# Patient Record
Sex: Female | Born: 1950
Health system: Southern US, Community
[De-identification: ages and names within clinical notes are randomized; demographics above are authoritative.]

## PROBLEM LIST (undated history)

## (undated) DIAGNOSIS — J189 Pneumonia, unspecified organism: Secondary | ICD-10-CM

## (undated) DIAGNOSIS — F329 Major depressive disorder, single episode, unspecified: Secondary | ICD-10-CM

## (undated) DIAGNOSIS — F32A Depression, unspecified: Secondary | ICD-10-CM

## (undated) DIAGNOSIS — M1990 Unspecified osteoarthritis, unspecified site: Secondary | ICD-10-CM

## (undated) DIAGNOSIS — K219 Gastro-esophageal reflux disease without esophagitis: Secondary | ICD-10-CM

## (undated) DIAGNOSIS — E7800 Pure hypercholesterolemia, unspecified: Secondary | ICD-10-CM

## (undated) DIAGNOSIS — H269 Unspecified cataract: Secondary | ICD-10-CM

## (undated) DIAGNOSIS — D649 Anemia, unspecified: Secondary | ICD-10-CM

## (undated) HISTORY — PX: ABDOMINAL HYSTERECTOMY: SHX81

## (undated) HISTORY — PX: TONSILLECTOMY: SUR1361

## (undated) HISTORY — DX: Unspecified cataract: H26.9

## (undated) HISTORY — PX: EYE SURGERY: SHX253

## (undated) HISTORY — PX: REPLACEMENT TOTAL KNEE: SUR1224

---

## 1998-07-30 ENCOUNTER — Other Ambulatory Visit: Admission: RE | Admit: 1998-07-30 | Discharge: 1998-07-30 | Payer: Self-pay | Admitting: Gynecology

## 1998-10-21 ENCOUNTER — Inpatient Hospital Stay (HOSPITAL_COMMUNITY): Admission: RE | Admit: 1998-10-21 | Discharge: 1998-10-23 | Payer: Self-pay | Admitting: Gynecology

## 2006-05-08 HISTORY — PX: BREAST BIOPSY: SHX20

## 2016-07-03 ENCOUNTER — Other Ambulatory Visit: Payer: Self-pay | Admitting: Endocrinology

## 2016-07-03 DIAGNOSIS — Z1231 Encounter for screening mammogram for malignant neoplasm of breast: Secondary | ICD-10-CM

## 2016-08-01 ENCOUNTER — Ambulatory Visit
Admission: RE | Admit: 2016-08-01 | Discharge: 2016-08-01 | Disposition: A | Discharge status: Home / Self Care | Payer: BLUE CROSS/BLUE SHIELD | Source: Ambulatory Visit | Attending: Endocrinology | Admitting: Endocrinology

## 2016-08-01 DIAGNOSIS — Z1231 Encounter for screening mammogram for malignant neoplasm of breast: Secondary | ICD-10-CM

## 2016-10-25 ENCOUNTER — Other Ambulatory Visit (HOSPITAL_COMMUNITY): Payer: Self-pay | Admitting: *Deleted

## 2016-10-25 NOTE — Pre-Procedure Instructions (Signed)
Paula CreeksKaren Delgado Poole  10/25/2016      [facility] [place] - [id number] [place] [address] Phone: [phone] Fax: [phone]    Your procedure is scheduled on 11/06/16.  [facility] to [facility] at 530 A.M.  Call this number if you have problems the morning of surgery:  [phone]   Remember:  Do not eat food or drink liquids after midnight.  Take these medicines the morning of surgery with A SIP OF WATER    Hydroxyzine(vistaril )if needed, sertraline(zoloft)  STOP all herbel meds, nsaids (aleve,naproxen,advil,ibuprofen) 7 days prior to surgery stating 10/30/16 including all vitamins/supplements,aspirin   Do not wear jewelry, make-up or nail polish.  Do not wear lotions, powders, or perfumes, or deoderant.  Do not shave 48 hours prior to surgery.  Men may shave face and neck.  Do not bring valuables to the hospital.  [facility] is not responsible for any belongings or valuables.  Contacts, dentures or bridgework may not be worn into surgery.  Leave your suitcase in the car.  After surgery it may be brought to your room.  For patients admitted to the hospital, discharge time will be determined by your treatment team.  Patients discharged the day of surgery will not be allowed to drive home.   Special instructions:  Special Instructions: [place] - Preparing for Surgery  Before surgery, you can play an important role.  Because skin is not sterile, your skin needs to be as free of germs as possible.  You can reduce the number of germs on you skin by washing with CHG (chlorahexidine gluconate) soap before surgery.  CHG is an antiseptic cleaner which kills germs and bonds with the skin to continue killing germs even after washing.  Please DO NOT use if you have an allergy to CHG or antibacterial soaps.  If your skin becomes reddened/irritated stop using the CHG and inform your nurse when you arrive  at Short Stay.  Do not shave (including legs and underarms) for at least 48 hours prior to the first CHG shower.  You may shave your face.  Please follow these instructions carefully:   1.  Shower with CHG Soap the night before surgery and the morning of Surgery.  2.  If you choose to wash your hair, wash your hair first as usual with your normal shampoo.  3.  After you shampoo, rinse your hair and body thoroughly to remove the Shampoo.  4.  Use CHG as you would any other liquid soap.  You can apply chg directly  to the skin and wash gently with scrungie or a clean washcloth.  5.  Apply the CHG Soap to your body ONLY FROM THE NECK DOWN.  Do not use on open wounds or open sores.  Avoid contact with your eyes ears, mouth and genitals (private parts).  Wash genitals (private parts)       with your normal soap.  6.  Wash thoroughly, paying special attention to the area where your surgery will be performed.  7.  Thoroughly rinse your body with warm water from the neck down.  8.  DO NOT shower/wash with your normal soap after using and rinsing off the CHG Soap.  9.  Pat yourself dry with a clean towel.            10.  Wear clean pajamas.  11.  Place clean sheets on your bed the night of your first shower and do not sleep with pets.  Day of Surgery  Do not apply any lotions/deodorants the morning of surgery.  Please wear clean clothes to the hospital/surgery center.  Please read over the  fact sheets that you were given.

## 2016-10-26 ENCOUNTER — Encounter (HOSPITAL_COMMUNITY)
Admission: RE | Admit: 2016-10-26 | Discharge: 2016-10-26 | Disposition: A | Discharge status: Home / Self Care | Payer: BLUE CROSS/BLUE SHIELD | Source: Ambulatory Visit | Attending: Orthopedic Surgery | Admitting: Orthopedic Surgery

## 2016-10-26 ENCOUNTER — Encounter (HOSPITAL_COMMUNITY): Payer: Self-pay

## 2016-10-26 ENCOUNTER — Other Ambulatory Visit: Payer: Self-pay

## 2016-10-26 DIAGNOSIS — R918 Other nonspecific abnormal finding of lung field: Secondary | ICD-10-CM | POA: Insufficient documentation

## 2016-10-26 DIAGNOSIS — Z01818 Encounter for other preprocedural examination: Secondary | ICD-10-CM | POA: Insufficient documentation

## 2016-10-26 DIAGNOSIS — Z01810 Encounter for preprocedural cardiovascular examination: Secondary | ICD-10-CM | POA: Diagnosis not present

## 2016-10-26 DIAGNOSIS — Z01812 Encounter for preprocedural laboratory examination: Secondary | ICD-10-CM | POA: Insufficient documentation

## 2016-10-26 HISTORY — DX: Gastro-esophageal reflux disease without esophagitis: K21.9

## 2016-10-26 HISTORY — DX: Major depressive disorder, single episode, unspecified: F32.9

## 2016-10-26 HISTORY — DX: Unspecified osteoarthritis, unspecified site: M19.90

## 2016-10-26 HISTORY — DX: Anemia, unspecified: D64.9

## 2016-10-26 HISTORY — DX: Pure hypercholesterolemia, unspecified: E78.00

## 2016-10-26 HISTORY — DX: Depression, unspecified: F32.A

## 2016-10-26 HISTORY — DX: Pneumonia, unspecified organism: J18.9

## 2016-10-26 LAB — BASIC METABOLIC PANEL
Anion gap: 9 (ref 5–15)
BUN: 20 mg/dL (ref 6–20)
CO2: 22 mmol/L (ref 22–32)
Calcium: 9.1 mg/dL (ref 8.9–10.3)
Chloride: 106 mmol/L (ref 101–111)
Creatinine, Ser: 0.75 mg/dL (ref 0.44–1.00)
GFR calc Af Amer: >60 mL/min (ref >60)
GFR calc non Af Amer: >60 mL/min (ref >60)
Glucose, Bld: 94 mg/dL (ref 65–99)
Potassium: 3.8 mmol/L (ref 3.5–5.1)
Sodium: 137 mmol/L (ref 135–145)

## 2016-10-26 LAB — CBC WITH DIFFERENTIAL/PLATELET
Basophils Absolute: 0 10*3/uL (ref 0.0–0.1)
Basophils Relative: 0 %
Eosinophils Absolute: 0.1 10*3/uL (ref 0.0–0.7)
Eosinophils Relative: 0 %
HCT: 39.4 % (ref 36.0–46.0)
Hemoglobin: 12.9 g/dL (ref 12.0–15.0)
Lymphocytes Relative: 8 %
Lymphs Abs: 1.2 10*3/uL (ref 0.7–4.0)
MCH: 28.4 pg (ref 26.0–34.0)
MCHC: 32.7 g/dL (ref 30.0–36.0)
MCV: 86.8 fL (ref 78.0–100.0)
Monocytes Absolute: 0.8 10*3/uL (ref 0.1–1.0)
Monocytes Relative: 6 %
Neutro Abs: 12.7 10*3/uL — ABNORMAL HIGH (ref 1.7–7.7)
Neutrophils Relative %: 86 %
Platelets: 107 10*3/uL — ABNORMAL LOW (ref 150–400)
RBC: 4.54 MIL/uL (ref 3.87–5.11)
RDW: 13.2 % (ref 11.5–15.5)
WBC: 14.8 10*3/uL — ABNORMAL HIGH (ref 4.0–10.5)

## 2016-10-26 LAB — URINALYSIS, ROUTINE W REFLEX MICROSCOPIC
Bilirubin Urine: NEGATIVE
Glucose, UA: NEGATIVE mg/dL
Hgb urine dipstick: NEGATIVE
Ketones, ur: NEGATIVE mg/dL
Nitrite: POSITIVE — AB
Protein, ur: NEGATIVE mg/dL
Specific Gravity, Urine: 1.019 (ref 1.005–1.030)
Squamous Epithelial / LPF: NONE SEEN
pH: 5 (ref 5.0–8.0)

## 2016-10-26 LAB — APTT: aPTT: 23 seconds — ABNORMAL LOW (ref 24–36)

## 2016-10-26 LAB — SURGICAL PCR SCREEN
MRSA, PCR: NEGATIVE
Staphylococcus aureus: NEGATIVE

## 2016-10-26 LAB — ABO/RH: ABO/RH(D): A POS

## 2016-10-26 LAB — PROTIME-INR
INR: 1.02
Prothrombin Time: 13.4 seconds (ref 11.4–15.2)

## 2016-10-26 LAB — TYPE AND SCREEN
ABO/RH(D): A POS
Antibody Screen: NEGATIVE

## 2016-10-26 NOTE — Pre-Procedure Instructions (Signed)
Paula Poole  10/26/2016     Your procedure is scheduled on Monday, November 06, 2016 at 7:30 AM.   Report to [facility] Admitting Office at 5:30 AM.   Call this number if you have problems the morning of surgery: [phone]   Questions prior to day of surgery, please call [phone] between 8 & 4 PM.   Remember:  Do not eat food or drink liquids after midnight Sunday, 11/05/16.  Take these medicines the morning of surgery with A SIP OF WATER: Sertraline (Zoloft)  Stop Multivitamins, NSAIDS (Naprosyn, Aleve, Ibuprofen, etc) and Herbal medications 7 days prior to surgery. Do not use Aspirin products 7 days prior to surgery.      Do not wear jewelry, make-up or nail polish.  Do not wear lotions, powders, perfumes or deodorant.  Do not shave 48 hours prior to surgery.  .  Do not bring valuables to the hospital.  [facility] is not responsible for any belongings or valuables.  Contacts, dentures or bridgework may not be worn into surgery.  Leave your suitcase in the car.  After surgery it may be brought to your room.  For patients admitted to the hospital, discharge time will be determined by your treatment team.    [place] - Preparing for Surgery  Before surgery, you can play an important role.  Because skin is not sterile, your skin needs to be as free of germs as possible.  You can reduce the number of germs on you skin by washing with CHG (chlorahexidine gluconate) soap before surgery.  CHG is an antiseptic cleaner which kills germs and bonds with the skin to continue killing germs even after washing.  Please DO NOT use if you have an allergy to CHG or antibacterial soaps.  If your skin becomes reddened/irritated stop using the CHG and inform your nurse when you arrive at Short Stay.  Do not shave (including legs and underarms) for at least 48 hours prior to the first CHG shower.  You may shave your face.  Please follow these instructions  carefully:   1.  Shower with CHG Soap the night before surgery and the morning of Surgery.  2.  If you choose to wash your hair, wash your hair first as usual with your normal shampoo.  3.  After you shampoo, rinse your hair and body thoroughly to remove the Shampoo.  4.  Use CHG as you would any other liquid soap.  You can apply chg directly  to the skin and wash gently with scrungie or a clean washcloth.  5.  Apply the CHG Soap to your body ONLY FROM THE NECK DOWN.  Do not use on open wounds or open sores.  Avoid contact with your eyes ears, mouth and genitals (private parts).  Wash genitals (private parts)  with your normal soap.  6.  Wash thoroughly, paying special attention to the area where your surgery will be performed.  7.  Thoroughly rinse your body with warm water from the neck down.  8.  DO NOT shower/wash with your normal soap after using and rinsing off the CHG Soap.  9.  Pat yourself dry with a clean towel.            10.  Wear clean pajamas.            11 .  Place clean sheets on your bed the night of your first shower and do not sleep with pets.  Day of Surgery  Do not apply any lotions/deodorants the morning of surgery.  Please wear clean clothes to the hospital.  Please read over the  fact sheets that you were given.

## 2016-10-26 NOTE — Progress Notes (Signed)
Pt denies cardiac history, chest pain or sob. Pt states she is not diabetic. 

## 2016-11-03 DIAGNOSIS — M1611 Unilateral primary osteoarthritis, right hip: Secondary | ICD-10-CM

## 2016-11-03 MED ORDER — DEXTROSE-NACL 5-0.45 % IV SOLN: INTRAVENOUS | Status: DC

## 2016-11-03 MED ORDER — TRANEXAMIC ACID 1000 MG/10ML IV SOLN
2000.0000 mg | INTRAVENOUS | Status: DC
  Filled 2016-11-03: qty 20

## 2016-11-03 MED ORDER — CEFAZOLIN SODIUM-DEXTROSE 2-4 GM/100ML-% IV SOLN
2.0000 g | INTRAVENOUS | Status: AC
  Administered 2016-11-06: 2 g via INTRAVENOUS
  Filled 2016-11-03: qty 100

## 2016-11-03 NOTE — H&P (Signed)
TOTAL HIP ADMISSION H&P  Patient is admitted for right total hip arthroplasty.  Subjective:  Chief Complaint: right hip pain  HPI: Paula Poole, 66 y.o. female, has a history of pain and functional disability in the right hip(s) due to arthritis and patient has failed non-surgical conservative treatments for greater than 12 weeks to include NSAID's and/or analgesics, corticosteriod injections, flexibility and strengthening excercises, use of assistive devices, weight reduction as appropriate and activity modification.  Onset of symptoms was gradual starting 1 years ago with gradually worsening course since that time.The patient noted no past surgery on the right hip(s).  Patient currently rates pain in the right hip at 10 out of 10 with activity. Patient has night pain, worsening of pain with activity and weight bearing, pain that interfers with activities of daily living and pain with passive range of motion. Patient has evidence of subchondral cysts, periarticular osteophytes, joint subluxation and joint space narrowing by imaging studies. This condition presents safety issues increasing the risk of falls.   There is no current active infection.  Patient Active Problem List   Diagnosis Date Noted  . Primary localized osteoarthritis of right hip 11/03/2016    Priority: High   Past Medical History:  Diagnosis Date  . Anemia    as a child  . Arthritis   . Depression    due to death of husband a year ago  . GERD (gastroesophageal reflux disease)    not for years  . High cholesterol   . Pneumonia     Past Surgical History:  Procedure Laterality Date  . ABDOMINAL HYSTERECTOMY    . BREAST BIOPSY  2008   (-) results per patient  . TONSILLECTOMY      No prescriptions prior to admission.   Allergies  Allergen Reactions  . Nickel Hives    Cheap jewelry makes her itchy  . Latex Itching    Social History  Substance Use Topics  . Smoking status: Former Smoker    Quit date: 09/25/2016   . Smokeless tobacco: Never Used  . Alcohol use No    Family History  Problem Relation Age of Onset  . Breast cancer Sister   . Thyroid cancer Sister   . Lymphoma Sister   . Atrial fibrillation Sister   . CVA Mother   . Colon cancer Father   . Heart attack Father   . High Cholesterol Father   . Diabetes Sister   . High Cholesterol Sister      Review of Systems  Constitutional: Negative.   HENT: Negative.   Eyes: Negative.   Respiratory: Negative.   Cardiovascular: Negative.   Gastrointestinal: Negative.   Genitourinary: Negative.   Musculoskeletal: Positive for joint pain.  Skin: Negative.   Neurological:       Poor balance  Endo/Heme/Allergies: Negative.   Psychiatric/Behavioral: Positive for depression. The patient is nervous/anxious.     Objective:  Physical Exam  Constitutional: She is oriented to person, place, and time. She appears well-developed and well-nourished.  HENT:  Head: Normocephalic and atraumatic.  Eyes: Pupils are equal, round, and reactive to light.  Neck: Normal range of motion. Neck supple.  Cardiovascular: Intact distal pulses.   Respiratory: Effort normal.  Musculoskeletal:  Patient walks with a profound right-sided limp.  Any attempts at internal rotation of the right hip causes severe pain.  The left hip has a full smooth range of motion.  Foot tap is negative bilaterally.  Skin is intact. She is neurovascularly intact distally.  Neurological: She is alert and oriented to person, place, and time.  Skin: Skin is warm and dry.  Psychiatric: She has a normal mood and affect. Her behavior is normal. Judgment and thought content normal.    Vital signs in last 24 hours:    Labs:   Estimated body mass index is 27.37 kg/m as calculated from the following:   Height as of 10/26/16: 5' 5.5" (1.664 m).   Weight as of 10/26/16: 75.8 kg (167 lb).   Imaging Review Plain radiographs demonstrate  AP pelvis and crosstable lateral the right hip  show bone-on-bone arthritis of the right hip with erosion of the superior femoral head and periacetabular cyst as well as bone spurs.  There is been lateral subluxation of the femoral head about 1 cm.  Assessment/Plan:  End stage arthritis, right hip(s)  The patient history, physical examination, clinical judgement of the provider and imaging studies are consistent with end stage degenerative joint disease of the right hip(s) and total hip arthroplasty is deemed medically necessary. The treatment options including medical management, injection therapy, arthroscopy and arthroplasty were discussed at length. The risks and benefits of total hip arthroplasty were presented and reviewed. The risks due to aseptic loosening, infection, stiffness, dislocation/subluxation,  thromboembolic complications and other imponderables were discussed.  The patient acknowledged the explanation, agreed to proceed with the plan and consent was signed. Patient is being admitted for inpatient treatment for surgery, pain control, PT, OT, prophylactic antibiotics, VTE prophylaxis, progressive ambulation and ADL's and discharge planning.The patient is planning to be discharged home with home health services.

## 2016-11-05 ENCOUNTER — Encounter (HOSPITAL_COMMUNITY): Payer: Self-pay | Admitting: Anesthesiology

## 2016-11-05 MED ORDER — BUPIVACAINE LIPOSOME 1.3 % IJ SUSP
20.0000 mL | Freq: Once | INTRAMUSCULAR | Status: DC
  Filled 2016-11-05: qty 20

## 2016-11-05 MED ORDER — TRANEXAMIC ACID 1000 MG/10ML IV SOLN
1000.0000 mg | INTRAVENOUS | Status: DC
  Filled 2016-11-05: qty 10

## 2016-11-05 MED ORDER — SODIUM CHLORIDE 0.9 % IV SOLN
2000.0000 mg | INTRAVENOUS | Status: AC
  Administered 2016-11-06: 500 mg via TOPICAL
  Filled 2016-11-05: qty 20

## 2016-11-05 MED ORDER — TRANEXAMIC ACID 1000 MG/10ML IV SOLN
1000.0000 mg | INTRAVENOUS | Status: AC
  Administered 2016-11-06: 1000 mg via INTRAVENOUS
  Filled 2016-11-05: qty 10

## 2016-11-05 NOTE — Anesthesia Preprocedure Evaluation (Addendum)
Anesthesia Evaluation  Patient identified by MRN, date of birth, ID band Patient awake    Reviewed: Allergy & Precautions, Patient's Chart, lab work & pertinent test results  Airway Mallampati: I       Dental no notable dental hx. (+) Teeth Intact   Pulmonary former smoker,    Pulmonary exam normal breath sounds clear to auscultation       Cardiovascular  Rhythm:Regular Rate:Normal     Neuro/Psych PSYCHIATRIC DISORDERS Depression    GI/Hepatic Neg liver ROS,   Endo/Other  negative endocrine ROS  Renal/GU negative Renal ROS     Musculoskeletal   Abdominal Normal abdominal exam  (+)   Peds  Hematology   Anesthesia Other Findings   Reproductive/Obstetrics                            Anesthesia Physical Anesthesia Plan  ASA: II  Anesthesia Plan: Spinal   Post-op Pain Management:    Induction:   PONV Risk Score and Plan: 2 and Ondansetron and Dexamethasone  Airway Management Planned: Natural Airway, Nasal Cannula and Simple Face Mask  Additional Equipment:   Intra-op Plan:   Post-operative Plan:   Informed Consent: I have reviewed the patients History and Physical, chart, labs and discussed the procedure including the risks, benefits and alternatives for the proposed anesthesia with the patient or authorized representative who has indicated his/her understanding and acceptance.     Plan Discussed with: CRNA and Surgeon  Anesthesia Plan Comments:        Anesthesia Quick Evaluation

## 2016-11-06 ENCOUNTER — Encounter (HOSPITAL_COMMUNITY)
Admission: RE | Disposition: A | Discharge status: Home Health | Payer: Self-pay | Source: Ambulatory Visit | Attending: Orthopedic Surgery

## 2016-11-06 ENCOUNTER — Inpatient Hospital Stay (HOSPITAL_COMMUNITY)
Admission: RE | Admit: 2016-11-06 | Discharge: 2016-11-08 | DRG: 470 | Disposition: A | Discharge status: Home Health | Payer: BLUE CROSS/BLUE SHIELD | Source: Ambulatory Visit | Attending: Orthopedic Surgery | Admitting: Orthopedic Surgery

## 2016-11-06 ENCOUNTER — Inpatient Hospital Stay (HOSPITAL_COMMUNITY): Payer: BLUE CROSS/BLUE SHIELD

## 2016-11-06 ENCOUNTER — Inpatient Hospital Stay (HOSPITAL_COMMUNITY): Payer: BLUE CROSS/BLUE SHIELD | Admitting: Anesthesiology

## 2016-11-06 DIAGNOSIS — M1611 Unilateral primary osteoarthritis, right hip: Secondary | ICD-10-CM | POA: Diagnosis present

## 2016-11-06 DIAGNOSIS — Z91040 Latex allergy status: Secondary | ICD-10-CM | POA: Diagnosis not present

## 2016-11-06 DIAGNOSIS — F329 Major depressive disorder, single episode, unspecified: Secondary | ICD-10-CM | POA: Diagnosis present

## 2016-11-06 DIAGNOSIS — Z8349 Family history of other endocrine, nutritional and metabolic diseases: Secondary | ICD-10-CM

## 2016-11-06 DIAGNOSIS — Z87891 Personal history of nicotine dependence: Secondary | ICD-10-CM | POA: Diagnosis not present

## 2016-11-06 DIAGNOSIS — Z91048 Other nonmedicinal substance allergy status: Secondary | ICD-10-CM | POA: Diagnosis not present

## 2016-11-06 DIAGNOSIS — Z419 Encounter for procedure for purposes other than remedying health state, unspecified: Secondary | ICD-10-CM

## 2016-11-06 DIAGNOSIS — K219 Gastro-esophageal reflux disease without esophagitis: Secondary | ICD-10-CM | POA: Diagnosis present

## 2016-11-06 DIAGNOSIS — D62 Acute posthemorrhagic anemia: Secondary | ICD-10-CM | POA: Diagnosis not present

## 2016-11-06 DIAGNOSIS — E7800 Pure hypercholesterolemia, unspecified: Secondary | ICD-10-CM | POA: Diagnosis present

## 2016-11-06 DIAGNOSIS — Z90710 Acquired absence of both cervix and uterus: Secondary | ICD-10-CM | POA: Diagnosis not present

## 2016-11-06 DIAGNOSIS — M25551 Pain in right hip: Secondary | ICD-10-CM | POA: Diagnosis present

## 2016-11-06 HISTORY — PX: TOTAL HIP ARTHROPLASTY: SHX124

## 2016-11-06 SURGERY — ARTHROPLASTY, HIP, TOTAL, ANTERIOR APPROACH
Anesthesia: Monitor Anesthesia Care | Site: Hip | Laterality: Right

## 2016-11-06 MED ORDER — METHOCARBAMOL 500 MG PO TABS: 500.0000 mg | ORAL_TABLET | Freq: Two times a day (BID) | ORAL | 0 refills | Status: DC

## 2016-11-06 MED ORDER — ASPIRIN EC 325 MG PO TBEC
325.0000 mg | DELAYED_RELEASE_TABLET | Freq: Every day | ORAL | Status: DC
  Administered 2016-11-07 – 2016-11-08 (×2): 325 mg via ORAL
  Filled 2016-11-06 (×2): qty 1

## 2016-11-06 MED ORDER — MIDAZOLAM HCL 2 MG/2ML IJ SOLN
INTRAMUSCULAR | Status: AC
  Filled 2016-11-06: qty 2

## 2016-11-06 MED ORDER — KCL IN DEXTROSE-NACL 20-5-0.45 MEQ/L-%-% IV SOLN
INTRAVENOUS | Status: DC
  Administered 2016-11-06: 12:00:00 via INTRAVENOUS
  Filled 2016-11-06 (×4): qty 1000

## 2016-11-06 MED ORDER — ONDANSETRON HCL 4 MG/2ML IJ SOLN
INTRAMUSCULAR | Status: DC | PRN
  Administered 2016-11-06: 4 mg via INTRAVENOUS

## 2016-11-06 MED ORDER — MENTHOL 3 MG MT LOZG: 1.0000 | LOZENGE | OROMUCOSAL | Status: DC | PRN

## 2016-11-06 MED ORDER — METOCLOPRAMIDE HCL 5 MG PO TABS: 5.0000 mg | ORAL_TABLET | Freq: Three times a day (TID) | ORAL | Status: DC | PRN

## 2016-11-06 MED ORDER — BUPIVACAINE LIPOSOME 1.3 % IJ SUSP
INTRAMUSCULAR | Status: DC | PRN
  Administered 2016-11-06: 20 mL

## 2016-11-06 MED ORDER — TRAZODONE HCL 50 MG PO TABS
50.0000 mg | ORAL_TABLET | Freq: Every day | ORAL | Status: DC
  Administered 2016-11-06 – 2016-11-07 (×2): 50 mg via ORAL
  Filled 2016-11-06 (×2): qty 1

## 2016-11-06 MED ORDER — METOCLOPRAMIDE HCL 5 MG/ML IJ SOLN: 5.0000 mg | Freq: Three times a day (TID) | INTRAMUSCULAR | Status: DC | PRN

## 2016-11-06 MED ORDER — CHLORHEXIDINE GLUCONATE 4 % EX LIQD: 60.0000 mL | Freq: Once | CUTANEOUS | Status: DC

## 2016-11-06 MED ORDER — ACETAMINOPHEN 325 MG PO TABS: 650.0000 mg | ORAL_TABLET | Freq: Four times a day (QID) | ORAL | Status: DC | PRN

## 2016-11-06 MED ORDER — METHOCARBAMOL 500 MG PO TABS
500.0000 mg | ORAL_TABLET | Freq: Four times a day (QID) | ORAL | Status: DC | PRN
  Administered 2016-11-07 – 2016-11-08 (×2): 500 mg via ORAL
  Filled 2016-11-06 (×2): qty 1

## 2016-11-06 MED ORDER — SODIUM CHLORIDE 0.9 % IV SOLN
INTRAVENOUS | Status: AC | PRN
  Administered 2016-11-06: 2000 mg via TOPICAL

## 2016-11-06 MED ORDER — LACTATED RINGERS IV SOLN
INTRAVENOUS | Status: DC | PRN
  Administered 2016-11-06: 07:00:00 via INTRAVENOUS

## 2016-11-06 MED ORDER — VITAMIN C 500 MG PO TABS
500.0000 mg | ORAL_TABLET | Freq: Every day | ORAL | Status: DC
  Administered 2016-11-06 – 2016-11-08 (×3): 500 mg via ORAL
  Filled 2016-11-06 (×3): qty 1

## 2016-11-06 MED ORDER — MIDAZOLAM HCL 5 MG/5ML IJ SOLN
INTRAMUSCULAR | Status: DC | PRN
  Administered 2016-11-06 (×2): 1 mg via INTRAVENOUS

## 2016-11-06 MED ORDER — BUPIVACAINE-EPINEPHRINE (PF) 0.5% -1:200000 IJ SOLN
INTRAMUSCULAR | Status: DC | PRN
  Administered 2016-11-06: 50 mL

## 2016-11-06 MED ORDER — BUPIVACAINE IN DEXTROSE 0.75-8.25 % IT SOLN
INTRATHECAL | Status: DC | PRN
  Administered 2016-11-06: 1.8 mL via INTRATHECAL

## 2016-11-06 MED ORDER — BUPIVACAINE-EPINEPHRINE (PF) 0.5% -1:200000 IJ SOLN
INTRAMUSCULAR | Status: AC
  Filled 2016-11-06: qty 30

## 2016-11-06 MED ORDER — PROPOFOL 500 MG/50ML IV EMUL
INTRAVENOUS | Status: DC | PRN
  Administered 2016-11-06: 50 ug via INTRAVENOUS

## 2016-11-06 MED ORDER — HYDROXYZINE HCL 25 MG PO TABS
50.0000 mg | ORAL_TABLET | Freq: Every day | ORAL | Status: DC
  Administered 2016-11-06 – 2016-11-07 (×2): 50 mg via ORAL
  Filled 2016-11-06 (×2): qty 2

## 2016-11-06 MED ORDER — PROPOFOL 10 MG/ML IV BOLUS
INTRAVENOUS | Status: AC
  Filled 2016-11-06: qty 40

## 2016-11-06 MED ORDER — CELECOXIB 200 MG PO CAPS
200.0000 mg | ORAL_CAPSULE | Freq: Two times a day (BID) | ORAL | Status: DC
  Administered 2016-11-06 – 2016-11-08 (×4): 200 mg via ORAL
  Filled 2016-11-06 (×4): qty 1

## 2016-11-06 MED ORDER — 0.9 % SODIUM CHLORIDE (POUR BTL) OPTIME
TOPICAL | Status: DC | PRN
  Administered 2016-11-06: 1000 mL

## 2016-11-06 MED ORDER — ONDANSETRON HCL 4 MG/2ML IJ SOLN
INTRAMUSCULAR | Status: AC
  Filled 2016-11-06: qty 2

## 2016-11-06 MED ORDER — OXYCODONE HCL 5 MG PO TABS
5.0000 mg | ORAL_TABLET | ORAL | Status: DC | PRN
  Administered 2016-11-06 – 2016-11-08 (×7): 10 mg via ORAL
  Filled 2016-11-06 (×7): qty 2

## 2016-11-06 MED ORDER — BISACODYL 5 MG PO TBEC: 5.0000 mg | DELAYED_RELEASE_TABLET | Freq: Every day | ORAL | Status: DC | PRN

## 2016-11-06 MED ORDER — ASPIRIN EC 325 MG PO TBEC: 325.0000 mg | DELAYED_RELEASE_TABLET | Freq: Two times a day (BID) | ORAL | 0 refills | Status: DC

## 2016-11-06 MED ORDER — DIPHENHYDRAMINE HCL 12.5 MG/5ML PO ELIX: 12.5000 mg | ORAL_SOLUTION | ORAL | Status: DC | PRN

## 2016-11-06 MED ORDER — PROPOFOL 500 MG/50ML IV EMUL
INTRAVENOUS | Status: DC | PRN
  Administered 2016-11-06: 50 ug/kg/min via INTRAVENOUS

## 2016-11-06 MED ORDER — ACETAMINOPHEN 650 MG RE SUPP: 650.0000 mg | Freq: Four times a day (QID) | RECTAL | Status: DC | PRN

## 2016-11-06 MED ORDER — DOCUSATE SODIUM 100 MG PO CAPS
100.0000 mg | ORAL_CAPSULE | Freq: Two times a day (BID) | ORAL | Status: DC
  Administered 2016-11-06 – 2016-11-08 (×4): 100 mg via ORAL
  Filled 2016-11-06 (×4): qty 1

## 2016-11-06 MED ORDER — COMPLETENATE 29-1 MG PO CHEW
1.0000 | CHEWABLE_TABLET | Freq: Every day | ORAL | Status: DC
  Filled 2016-11-06: qty 1

## 2016-11-06 MED ORDER — HYDROXYZINE PAMOATE 50 MG PO CAPS
50.0000 mg | ORAL_CAPSULE | Freq: Every day | ORAL | Status: DC
  Filled 2016-11-06: qty 1

## 2016-11-06 MED ORDER — OXYCODONE-ACETAMINOPHEN 5-325 MG PO TABS: 1.0000 | ORAL_TABLET | ORAL | 0 refills | Status: DC | PRN

## 2016-11-06 MED ORDER — FENTANYL CITRATE (PF) 250 MCG/5ML IJ SOLN
INTRAMUSCULAR | Status: AC
  Filled 2016-11-06: qty 5

## 2016-11-06 MED ORDER — PHENOL 1.4 % MT LIQD: 1.0000 | OROMUCOSAL | Status: DC | PRN

## 2016-11-06 MED ORDER — PROPOFOL 10 MG/ML IV BOLUS
INTRAVENOUS | Status: DC | PRN
  Administered 2016-11-06: 20 mg via INTRAVENOUS

## 2016-11-06 MED ORDER — ONDANSETRON HCL 4 MG/2ML IJ SOLN: 4.0000 mg | Freq: Four times a day (QID) | INTRAMUSCULAR | Status: DC | PRN

## 2016-11-06 MED ORDER — FENTANYL CITRATE (PF) 100 MCG/2ML IJ SOLN
INTRAMUSCULAR | Status: DC | PRN
  Administered 2016-11-06 (×3): 50 ug via INTRAVENOUS

## 2016-11-06 MED ORDER — ALUMINUM HYDROXIDE GEL 320 MG/5ML PO SUSP
15.0000 mL | ORAL | Status: DC | PRN
  Filled 2016-11-06: qty 30

## 2016-11-06 MED ORDER — POLYETHYLENE GLYCOL 3350 17 G PO PACK: 17.0000 g | PACK | Freq: Every day | ORAL | Status: DC | PRN

## 2016-11-06 MED ORDER — HYDROMORPHONE HCL 1 MG/ML IJ SOLN: 0.5000 mg | INTRAMUSCULAR | Status: DC | PRN

## 2016-11-06 MED ORDER — SERTRALINE HCL 100 MG PO TABS
100.0000 mg | ORAL_TABLET | Freq: Every day | ORAL | Status: DC
  Administered 2016-11-07 – 2016-11-08 (×2): 100 mg via ORAL
  Filled 2016-11-06 (×2): qty 1

## 2016-11-06 MED ORDER — METHOCARBAMOL 1000 MG/10ML IJ SOLN
500.0000 mg | Freq: Four times a day (QID) | INTRAMUSCULAR | Status: DC | PRN
  Filled 2016-11-06: qty 5

## 2016-11-06 MED ORDER — TRANEXAMIC ACID 1000 MG/10ML IV SOLN
1000.0000 mg | Freq: Once | INTRAVENOUS | Status: AC
  Administered 2016-11-06: 1000 mg via INTRAVENOUS
  Filled 2016-11-06: qty 10

## 2016-11-06 MED ORDER — DEXAMETHASONE SODIUM PHOSPHATE 10 MG/ML IJ SOLN
10.0000 mg | Freq: Once | INTRAMUSCULAR | Status: AC
  Administered 2016-11-07: 10 mg via INTRAVENOUS
  Filled 2016-11-06 (×3): qty 1

## 2016-11-06 MED ORDER — PHENYLEPHRINE HCL 10 MG/ML IJ SOLN
INTRAMUSCULAR | Status: DC | PRN
  Administered 2016-11-06 (×2): 80 ug via INTRAVENOUS

## 2016-11-06 MED ORDER — EPHEDRINE SULFATE 50 MG/ML IJ SOLN
INTRAMUSCULAR | Status: DC | PRN
  Administered 2016-11-06 (×2): 10 mg via INTRAVENOUS

## 2016-11-06 MED ORDER — CVS PRENATAL GUMMY 0.4-113.5 MG PO CHEW: CHEWABLE_TABLET | Freq: Every day | ORAL | Status: DC

## 2016-11-06 MED ORDER — ONDANSETRON HCL 4 MG PO TABS
4.0000 mg | ORAL_TABLET | Freq: Four times a day (QID) | ORAL | Status: DC | PRN
  Filled 2016-11-06: qty 1

## 2016-11-06 MED ORDER — FLEET ENEMA 7-19 GM/118ML RE ENEM: 1.0000 | ENEMA | Freq: Once | RECTAL | Status: DC | PRN

## 2016-11-06 SURGICAL SUPPLY — 48 items
BAG DECANTER FOR FLEXI CONT (MISCELLANEOUS) ×6 IMPLANT
BLADE SAW SGTL 18X1.27X75 (BLADE) ×2 IMPLANT
BLADE SURG 10 STRL SS (BLADE) ×2 IMPLANT
CAPT HIP TOTAL 2 ×2 IMPLANT
COVER PERINEAL POST (MISCELLANEOUS) ×2 IMPLANT
COVER SURGICAL LIGHT HANDLE (MISCELLANEOUS) ×2 IMPLANT
DRAPE C-ARM 42X72 X-RAY (DRAPES) ×2 IMPLANT
DRAPE STERI IOBAN 125X83 (DRAPES) ×2 IMPLANT
DRAPE U-SHAPE 47X51 STRL (DRAPES) ×4 IMPLANT
DRSG AQUACEL AG ADV 3.5X10 (GAUZE/BANDAGES/DRESSINGS) ×2 IMPLANT
DURAPREP 26ML APPLICATOR (WOUND CARE) ×2 IMPLANT
ELECT BLADE 4.0 EZ CLEAN MEGAD (MISCELLANEOUS) ×2
ELECT REM PT RETURN 9FT ADLT (ELECTROSURGICAL) ×2
ELECTRODE BLDE 4.0 EZ CLN MEGD (MISCELLANEOUS) ×1 IMPLANT
ELECTRODE REM PT RTRN 9FT ADLT (ELECTROSURGICAL) ×1 IMPLANT
FACESHIELD WRAPAROUND (MASK) ×10 IMPLANT
GLOVE BIO SURGEON STRL SZ7.5 (GLOVE) ×2 IMPLANT
GLOVE BIO SURGEON STRL SZ8.5 (GLOVE) ×2 IMPLANT
GLOVE BIOGEL PI IND STRL 8 (GLOVE) ×1 IMPLANT
GLOVE BIOGEL PI IND STRL 9 (GLOVE) ×1 IMPLANT
GLOVE BIOGEL PI INDICATOR 8 (GLOVE) ×1
GLOVE BIOGEL PI INDICATOR 9 (GLOVE) ×1
GLOVE SS N UNI LF 8.0 STRL (GLOVE) ×2 IMPLANT
GLOVE SURG SS PI 7.5 STRL IVOR (GLOVE) ×2 IMPLANT
GLOVE SURG SS PI 8.5 STRL IVOR (GLOVE) ×1
GLOVE SURG SS PI 8.5 STRL STRW (GLOVE) ×1 IMPLANT
GOWN STRL REUS W/ TWL LRG LVL3 (GOWN DISPOSABLE) ×1 IMPLANT
GOWN STRL REUS W/ TWL XL LVL3 (GOWN DISPOSABLE) ×2 IMPLANT
GOWN STRL REUS W/TWL LRG LVL3 (GOWN DISPOSABLE) ×1
GOWN STRL REUS W/TWL XL LVL3 (GOWN DISPOSABLE) ×2
KIT BASIN OR (CUSTOM PROCEDURE TRAY) ×2 IMPLANT
KIT ROOM TURNOVER OR (KITS) ×2 IMPLANT
MANIFOLD NEPTUNE II (INSTRUMENTS) ×2 IMPLANT
NEEDLE HYPO 22GX1.5 SAFETY (NEEDLE) ×4 IMPLANT
NS IRRIG 1000ML POUR BTL (IV SOLUTION) ×2 IMPLANT
PACK TOTAL JOINT (CUSTOM PROCEDURE TRAY) ×2 IMPLANT
PAD ARMBOARD 7.5X6 YLW CONV (MISCELLANEOUS) ×4 IMPLANT
SPONGE LAP 18X18 X RAY DECT (DISPOSABLE) ×2 IMPLANT
SUT VIC AB 1 CTX 36 (SUTURE) ×1
SUT VIC AB 1 CTX36XBRD ANBCTR (SUTURE) ×1 IMPLANT
SUT VIC AB 2-0 CT1 27 (SUTURE) ×2
SUT VIC AB 2-0 CT1 TAPERPNT 27 (SUTURE) ×2 IMPLANT
SUT VIC AB 3-0 PS2 18 (SUTURE) ×1
SUT VIC AB 3-0 PS2 18XBRD (SUTURE) ×1 IMPLANT
SYR CONTROL 10ML LL (SYRINGE) ×4 IMPLANT
TOWEL OR 17X24 6PK STRL BLUE (TOWEL DISPOSABLE) ×2 IMPLANT
TOWEL OR 17X26 10 PK STRL BLUE (TOWEL DISPOSABLE) ×2 IMPLANT
TRAY CATH 16FR W/PLASTIC CATH (SET/KITS/TRAYS/PACK) IMPLANT

## 2016-11-06 NOTE — Anesthesia Procedure Notes (Signed)
Spinal  Start time: 11/06/2016 7:46 AM End time: 11/06/2016 7:49 AM Staffing Anesthesiologist: Leilani AbleHATCHETT, Abbigayle Toole Performed: anesthesiologist  Preanesthetic Checklist Completed: patient identified, surgical consent, pre-op evaluation, timeout performed, IV checked, risks and benefits discussed and monitors and equipment checked Spinal Block Patient position: sitting Prep: ChloraPrep and site prepped and draped Patient monitoring: heart rate, cardiac monitor, continuous pulse ox and blood pressure Approach: midline Location: L3-4 Injection technique: single-shot Needle Needle type: Pencan  Needle gauge: 24 G Needle length: 10 cm Assessment Sensory level: T8

## 2016-11-06 NOTE — Op Note (Signed)
OPERATIVE REPORT    DATE OF PROCEDURE:  11/06/2016       PREOPERATIVE DIAGNOSIS:  RIGHT HIP OSTEOARTHRITIS                                                          POSTOPERATIVE DIAGNOSIS:  RIGHT HIP OSTEOARTHRITIS                                                           PROCEDURE: Anterior R total hip arthroplasty using a 48 mm DePuy Pinnacle  Cup, [employer], 0-degree polyethylene liner, a +1 32 mm ceramic head, a 2 hi offset Depuy Triloc stem   SURGEON: ZOXWR,UEAVW J    ASSISTANT:   Eric K. [employer]  (present throughout entire procedure and necessary for timely completion of the procedure)   ANESTHESIA: Spinal BLOOD LOSS: 400 FLUID REPLACEMENT: 1500 crystalloid Antibiotic: 2gm ancef Tranexamic Acid: 1gm iv 2 gm topical COMPLICATIONS: none    INDICATIONS FOR PROCEDURE: A 66 y.o. year-old With  RIGHT HIP OSTEOARTHRITIS   for 2 years, x-rays show bone-on-bone arthritic changes, and osteophytes. Despite conservative measures with observation, anti-inflammatory medicine, narcotics, use of a cane, has severe unremitting pain and can ambulate only a few blocks before resting. Patient desires elective R total hip arthroplasty to decrease pain and increase function. The risks, benefits, and alternatives were discussed at length including but not limited to the risks of infection, bleeding, nerve injury, stiffness, blood clots, the need for revision surgery, cardiopulmonary complications, among others, and they were willing to proceed. Questions answered     PROCEDURE IN DETAIL: The patient was identified by armband,  received preoperative IV antibiotics in the holding area at [facility], taken to the operating room , appropriate anesthetic monitors  were attached and  anesthesia was induced with the patienton the gurney. The HANA boots were applied to the feet and he was then transferred to the HANA table with a peroneal post and support underneath the  non-operative le, which was locked in 5 lb traction. Theoperative lower extremity was then prepped and draped in the usual sterile fashion from just above the iliac crest to the knee. And a timeout procedure was performed. We then made a 12 cm incision along the interval at the leading edge of the tensor fascia lata of starting at 2 cm lateral to and 2 cm distal to the ASIS. Small bleeders in the skin and subcutaneous tissue identified and cauterized we dissected down to the fascia and made an incision in the fascia allowing [place] to elevate the fascia of the tensor muscle and exploited the interval between the rectus and the tensor fascia lata. A Hohmann retractor was then placed along the superior neck of the femur and a Cobra retractor along the inferior neck of the femur we teed the capsule starting out at the superior anterior aspect of the acetabulum going distally and made the T along the neck both leaflets of the T were tagged with #2 Ethibond suture. Cobra retractors were then placed along the inferior and superior neck allowing [place] to perform a standard neck  cut and removed the femoral head with a power corkscrew. We then placed a right angle Hohmann retractor along the anterior aspect of the acetabulum a spiked Cobra in the cotyloid notch and posteriorly a Muelller retractor. We then sequentially reamed up to a 48 mm basket reamer obtaining good coverage in all quadrants, verified by C-arm imaging. Under C-arm control with and hammered into place a 48 mm Pinnacle cup in 45 of abduction and 15 of anteversion. The cup seated nicely and required no supplemental screws. We then placed a central hole Eliminator and a 0 polyethylene liner. The foot was then externally rotated to 110, the HANA elevator was placed around the flare of the greater trochanter and the limb was extended and abducted delivering the proximal femur up into the wound. A medium Hohmann retractor was placed over the greater trochanter and a  Mueller retractor along the posterior femoral neck completing the exposure. We then performed releases superiorly and and inferiorly of the capsule going back to the pirformis fossa superiorly and to the lesser trochanter inferiorly. We then entered the proximal femur with the box cutting offset chisel followed by, a canal sounder, the chili pepper and broaching up to a 2 broach. This seated nicely and we reamed the calcar. A trial reduction was performed with a 1 mm 32 mm head.The limb lengths were excellent the hip was stable in 90 of external rotation. At this point the trial components removed and we hammered into place a # 2 Tri-Lock stem with [employer] coating. This was a hi offset stem and a + 1 32 mm ceramic ball was then hammered into place the hip was reduced and final C-arm images obtained. The wound was thoroughly irrigated with normal saline solution. We repaired the ant capsule and the tensor fascia lot a with running 0 vicryl suture. the subcutaneous tissue was closed with 2-0 and 3-0 Vicryl suture followed by an Aquacil dressing. At this point the patient was awaken and transferred to hospital gurney without difficulty. The subcutaneous tissue with 0 and 2-0 undyed Vicryl suture and the skin with running  3-0 vicryl subcuticular suture. Aquacil dressing was applied. The patient was then unclamped, rolled supine, awaken extubated and taken to recovery room without difficulty in stable condition.   Nasia Cannan J 11/06/2016, 9:03 AM

## 2016-11-06 NOTE — Discharge Instructions (Signed)

## 2016-11-06 NOTE — Interval H&P Note (Signed)
History and Physical Interval Note:  11/06/2016 7:01 AM  Paula Poole  has presented today for surgery, with the diagnosis of RIGHT HIP OSTEOARTHRITIS  The various methods of treatment have been discussed with the patient and family. After consideration of risks, benefits and other options for treatment, the patient has consented to  Procedure(s): TOTAL HIP ARTHROPLASTY ANTERIOR APPROACH (Right) as a surgical intervention .  The patient's history has been reviewed, patient examined, no change in status, stable for surgery.  I have reviewed the patient's chart and labs.  Questions were answered to the patient's satisfaction.     Nestor LewandowskyOWAN,Libia Fazzini J

## 2016-11-06 NOTE — Evaluation (Signed)
Physical Therapy Evaluation Patient Details Name: Paula Poole MRN: [id number] DOB: 1950-10-02 Today's Date: 11/06/2016   History of Present Illness  Pt is a 66 y/o female s/p R THA, direct anterior approach, secondary to R hip OA. PMH includes arthritis and depression.   Clinical Impression  Pt s/p surgery above with deficits below. PTA, pt was using a cane for ambulation. Upon evaluation, pt anxious about pain and movement and asked multiple times if the pain she was having was normal. Limited ambulation distance this session secondary to reports of dizziness. Pt requiring multiple safety cues throughout session and min guard assist to ensure safety with mobility. Pt reports her sister will be available to help upon return home. Follow up recommendations per MD arrangements. Will continue to follow acutely to maximize functional mobility independence.     Follow Up Recommendations DC plan and follow up therapy as arranged by surgeon;Supervision/Assistance - 24 hour    Equipment Recommendations  None recommended by PT    Recommendations for Other Services       Precautions / Restrictions Precautions Precautions: None Precaution Comments: Reviewed supine ther ex with pt from THA packet Restrictions Weight Bearing Restrictions: Yes RLE Weight Bearing: Weight bearing as tolerated      Mobility  Bed Mobility Overal bed mobility: Needs Assistance Bed Mobility: Supine to Sit     Supine to sit: Min guard     General bed mobility comments: Min guard for safety. Verbal cues for sequencing.   Transfers Overall transfer level: Needs assistance Equipment used: Rolling walker (2 wheeled) Transfers: Sit to/from Stand Sit to Stand: Min assist         General transfer comment: Min A for steadying once standing. Verbal cues for safe hand placement, although pt continued to pull up from RW. States "I just have all this stuff hooked up to me, but I know how to do it correctly."    Ambulation/Gait Ambulation/Gait assistance: Min guard Ambulation Distance (Feet): 20 Feet Assistive device: Rolling walker (2 wheeled) Gait Pattern/deviations: Step-to pattern;Step-through pattern;Decreased step length - right;Decreased weight shift to right;Decreased step length - left;Antalgic Gait velocity: Decreased Gait velocity interpretation: Below normal speed for age/gender General Gait Details: Slow, antalgic gait secondary to post op pain and weakness. Pt required verbal safety cues for sequencing with RW. Pt initially reporting "out of body experience" and asked about dizziness, however, pt declined symptoms. Pt reporting dizziness as she walked further, therefore distance limited.   Stairs            Wheelchair Mobility    Modified Rankin (Stroke Patients Only)       Balance Overall balance assessment: Needs assistance Sitting-balance support: No upper extremity supported;Feet supported Sitting balance-Leahy Scale: Good     Standing balance support: Bilateral upper extremity supported;During functional activity Standing balance-Leahy Scale: Poor Standing balance comment: Reliant on BUE support for balance.                              Pertinent Vitals/Pain Pain Assessment: 0-10 Pain Score: 7  Pain Location: R Hip  Pain Descriptors / Indicators: Aching;Sore;Operative site guarding;Constant Pain Intervention(s): Limited activity within patient's tolerance;Monitored during session;Repositioned    Home Living Family/patient expects to be discharged to:: Private residence Living Arrangements: Other relatives (Sister ) Available Help at Discharge: Family;Available 24 hours/day Type of Home: Other(Comment) (condo ) Home Access: Level entry     Home Layout: One level Home Equipment: Shower  seat - built in;Grab bars - tub/shower;Grab bars - toilet;Walker - 2 wheels;Cane - single point      Prior Function Level of Independence: Independent with  assistive device(s)         Comments: Used cane at baseline      Hand Dominance   Dominant Hand: Right    Extremity/Trunk Assessment   Upper Extremity Assessment Upper Extremity Assessment: Overall WFL for tasks assessed    Lower Extremity Assessment Lower Extremity Assessment: RLE deficits/detail RLE Deficits / Details: Sensory in tact. Able to perform exercises below. Pt able to perform exercises below.     Cervical / Trunk Assessment Cervical / Trunk Assessment: Normal  Communication   Communication: No difficulties  Cognition Arousal/Alertness: Awake/alert Behavior During Therapy: Anxious Overall Cognitive Status: Within Functional Limits for tasks assessed                                 General Comments: Pt asking multiple times if the pain and symptoms are normal.       General Comments General comments (skin integrity, edema, etc.): Pt ensured throughout session, that her symptoms were not abnormal for day of surgery. Required verbal cues for sequencing during supine ther ex as pt was not controlling RLE with exercises.     Exercises Total Joint Exercises Ankle Circles/Pumps: AROM;Both;10 reps;Supine Quad Sets: AROM;Right;10 reps;Supine Short Arc Quad: AROM;Right;10 reps;Supine Heel Slides: AROM;Right;10 reps;Supine Hip ABduction/ADduction: AROM;Right;10 reps;Supine   Assessment/Plan    PT Assessment Patient needs continued PT services  PT Problem List Decreased strength;Decreased activity tolerance;Decreased balance;Decreased mobility;Decreased knowledge of use of DME;Decreased knowledge of precautions;Pain       PT Treatment Interventions DME instruction;Gait training;Functional mobility training;Therapeutic exercise;Therapeutic activities;Balance training;Neuromuscular re-education;Patient/family education    PT Goals (Current goals can be found in the Care Plan section)  Acute Rehab PT Goals Patient Stated Goal: to go home  PT Goal  Formulation: With patient Time For Goal Achievement: 11/13/16 Potential to Achieve Goals: Good    Frequency 7X/week   Barriers to discharge        Co-evaluation               AM-PAC PT "6 Clicks" Daily Activity  Outcome Measure Difficulty turning over in bed (including adjusting bedclothes, sheets and blankets)?: A Little Difficulty moving from lying on back to sitting on the side of the bed? : A Little Difficulty sitting down on and standing up from a chair with arms (e.g., wheelchair, bedside commode, etc,.)?: Total Help needed moving to and from a bed to chair (including a wheelchair)?: A Little Help needed walking in hospital room?: A Little Help needed climbing 3-5 steps with a railing? : A Lot 6 Click Score: 15    End of Session Equipment Utilized During Treatment: Gait belt Activity Tolerance: Treatment limited secondary to medical complications (Comment) (dizziness) Patient left: in chair;with call bell/phone within reach Nurse Communication: Mobility status PT Visit Diagnosis: Other abnormalities of gait and mobility (R26.89);Pain Pain - Right/Left: Right Pain - part of body: Hip    Time: [id number] PT Time Calculation (min) (ACUTE ONLY): 20 min   Charges:   PT Evaluation $PT Eval Low Complexity: 1 Procedure PT Treatments $Gait Training: 8-22 mins   PT G Codes:        Gladys DammeBrittany Aarion Kittrell, PT, DPT  Acute Rehabilitation Services  Pager: [phone]   Lehman PromBrittany S [place]ree 11/06/2016, 3:34 PM

## 2016-11-06 NOTE — Transfer of Care (Signed)
Immediate Anesthesia Transfer of Care Note  Patient: Paula Poole  Procedure(s) Performed: Procedure(s): TOTAL HIP ARTHROPLASTY ANTERIOR APPROACH (Right)  Patient Location: PACU  Anesthesia Type:MAC and Spinal  Level of Consciousness: awake, alert , oriented and sedated  Airway & Oxygen Therapy: Patient Spontanous Breathing and Patient connected to nasal cannula oxygen  Post-op Assessment: Report given to RN, Post -op Vital signs reviewed and stable and Patient moving all extremities  Post vital signs: Reviewed and stable  Last Vitals:  Vitals:   11/06/16 0635  BP: (!) 124/47  Pulse: 71  Resp: 20  Temp: 36.7 C    Last Pain:  Vitals:   11/06/16 0635  TempSrc: Oral         Complications: No apparent anesthesia complications

## 2016-11-06 NOTE — Anesthesia Postprocedure Evaluation (Signed)
Anesthesia Post Note  Patient: Paula Poole  Procedure(s) Performed: Procedure(s) (LRB): TOTAL HIP ARTHROPLASTY ANTERIOR APPROACH (Right)     Patient location during evaluation: PACU Anesthesia Type: Spinal Level of consciousness: awake Pain management: pain level controlled Vital Signs Assessment: post-procedure vital signs reviewed and stable Respiratory status: spontaneous breathing Cardiovascular status: stable Postop Assessment: no headache, no backache, spinal receding, patient able to bend at knees and no signs of nausea or vomiting Anesthetic complications: no    Last Vitals:  Vitals:   11/06/16 1025 11/06/16 1030  BP: (!) 96/56   Pulse: 81 80  Resp: 13 14  Temp:      Last Pain:  Vitals:   11/06/16 0940  TempSrc:   PainSc: 0-No pain   Pain Goal:                 Tangee Marszalek JR,JOHN Traci Plemons

## 2016-11-06 NOTE — Addendum Note (Signed)
Addendum  created 11/06/16 1134 by Leilani AbleHatchett, Shadoe Bethel, MD   Anesthesia Intra Meds edited

## 2016-11-07 ENCOUNTER — Encounter (HOSPITAL_COMMUNITY): Payer: Self-pay | Admitting: General Practice

## 2016-11-07 LAB — BASIC METABOLIC PANEL
Anion gap: 7 (ref 5–15)
BUN: 11 mg/dL (ref 6–20)
CO2: 22 mmol/L (ref 22–32)
Calcium: 8.1 mg/dL — ABNORMAL LOW (ref 8.9–10.3)
Chloride: 108 mmol/L (ref 101–111)
Creatinine, Ser: 0.68 mg/dL (ref 0.44–1.00)
GFR calc Af Amer: >60 mL/min (ref >60)
GFR calc non Af Amer: >60 mL/min (ref >60)
Glucose, Bld: 145 mg/dL — ABNORMAL HIGH (ref 65–99)
Potassium: 4 mmol/L (ref 3.5–5.1)
Sodium: 137 mmol/L (ref 135–145)

## 2016-11-07 LAB — CBC
HCT: 30.4 % — ABNORMAL LOW (ref 36.0–46.0)
Hemoglobin: 9.8 g/dL — ABNORMAL LOW (ref 12.0–15.0)
MCH: 29 pg (ref 26.0–34.0)
MCHC: 32.2 g/dL (ref 30.0–36.0)
MCV: 89.9 fL (ref 78.0–100.0)
Platelets: 60 10*3/uL — ABNORMAL LOW (ref 150–400)
RBC: 3.38 MIL/uL — ABNORMAL LOW (ref 3.87–5.11)
RDW: 13.8 % (ref 11.5–15.5)
WBC: 7.5 10*3/uL (ref 4.0–10.5)

## 2016-11-07 MED ORDER — PRENATAL PLUS 27-1 MG PO TABS
1.0000 | ORAL_TABLET | Freq: Every day | ORAL | Status: DC
  Administered 2016-11-07: 1 via ORAL
  Filled 2016-11-07 (×2): qty 1

## 2016-11-07 NOTE — Progress Notes (Signed)
Physical Therapy Treatment Patient Details Name: Paula Poole MRN: [id number] DOB: [birth year] Today's Date: 11/07/2016    History of Present Illness Pt is a 66 y/o female s/p R THA, direct anterior approach, secondary to R hip OA. PMH includes arthritis and depression.     PT Comments    On arrival patient drenched in sweat.  Assisted patient to rest room and helped her changed her gowns.  Pt reports feeling better.  Vital obtained and no fever noted.  Pt able to advance gait distance this pm and reviewed HEP in it's entirety.  Pt to d/c home tomorrow and will plan to f/u in am.     Follow Up Recommendations  DC plan and follow up therapy as arranged by surgeon;Supervision/Assistance - 24 hour     Equipment Recommendations  None recommended by PT    Recommendations for Other Services       Precautions / Restrictions Precautions Precautions: None Precaution Comments: Reviewed supine, seated and standing ther ex with pt from THA packet Restrictions Weight Bearing Restrictions: Yes RLE Weight Bearing: Weight bearing as tolerated    Mobility  Bed Mobility Overal bed mobility: Needs Assistance Bed Mobility: Supine to Sit     Supine to sit: Supervision;HOB elevated     General bed mobility comments: Increased time with use of rail, guarded due to pain.    Transfers Overall transfer level: Needs assistance Equipment used: Rolling walker (2 wheeled) Transfers: Sit to/from Stand Sit to Stand: Supervision         General transfer comment: Cues for hand placement and keeping RW close during transitions from sit to stand and stand to sit.  Pt required review of technique for safety.    Ambulation/Gait Ambulation/Gait assistance: Min guard Ambulation Distance (Feet): 300 Feet Assistive device: Rolling walker (2 wheeled) Gait Pattern/deviations: Step-through pattern;Decreased stride length;Trunk flexed;Antalgic Gait velocity: Decreased Gait velocity interpretation: Below  normal speed for age/gender General Gait Details: Less antalgic this afternoon during gait training.  Pt required cues for upper trunk control and pacing.     Stairs            Wheelchair Mobility    Modified Rankin (Stroke Patients Only)       Balance Overall balance assessment: Needs assistance Sitting-balance support: No upper extremity supported;Feet supported Sitting balance-Leahy Scale: Good     Standing balance support: Bilateral upper extremity supported;During functional activity Standing balance-Leahy Scale: Fair Standing balance comment: Reliant on BUE support for balance.                             Cognition Arousal/Alertness: Awake/alert Behavior During Therapy: Anxious;WFL for tasks assessed/performed Overall Cognitive Status: Within Functional Limits for tasks assessed                                 General Comments: Pt remains anxious during session.        Exercises Total Joint Exercises Ankle Circles/Pumps: AROM;Both;10 reps;Supine Quad Sets: AROM;Right;10 reps;Supine Short Arc Quad: AROM;Right;10 reps;Supine Heel Slides: AROM;Right;10 reps;Supine Hip ABduction/ADduction: AROM;Right;10 reps;Supine;20 reps;Standing (1x10 in supine, 1x10 in standing.  ) Long Arc Quad: AROM;Right;10 reps;Supine Knee Flexion: AROM;Right;10 reps;Standing Marching in Standing: AROM;Right;10 reps;Standing Standing Hip Extension: AROM;Right;10 reps;Standing    General Comments        Pertinent Vitals/Pain Pain Assessment: 0-10 Pain Score: 8  Pain Location: R Hip  Pain Descriptors /  Indicators: Aching;Sore;Operative site guarding;Constant Pain Intervention(s): Monitored during session;Repositioned    Home Living                      Prior Function            PT Goals (current goals can now be found in the care plan section) Acute Rehab PT Goals Patient Stated Goal: to go home  Potential to Achieve Goals: Good Progress  towards PT goals: Progressing toward goals    Frequency    7X/week      PT Plan Current plan remains appropriate    Co-evaluation              AM-PAC PT "6 Clicks" Daily Activity  Outcome Measure  Difficulty turning over in bed (including adjusting bedclothes, sheets and blankets)?: A Little Difficulty moving from lying on back to sitting on the side of the bed? : A Little Difficulty sitting down on and standing up from a chair with arms (e.g., wheelchair, bedside commode, etc,.)?: A Little Help needed moving to and from a bed to chair (including a wheelchair)?: A Little Help needed walking in hospital room?: A Little Help needed climbing 3-5 steps with a railing? : A Little 6 Click Score: 18    End of Session Equipment Utilized During Treatment: Gait belt Activity Tolerance: Patient tolerated treatment well Patient left: in chair;with call bell/phone within reach Nurse Communication: Mobility status PT Visit Diagnosis: Other abnormalities of gait and mobility (R26.89);Pain Pain - Right/Left: Right Pain - part of body: Hip     Time: 1520-1605 PT Time Calculation (min) (ACUTE ONLY): 45 min  Charges:  $Gait Training: 8-22 mins $Therapeutic Exercise: 8-22 mins $Therapeutic Activity: 8-22 mins                    G Codes:       Paula Poole, PTA pager [phone]    Paula Poole 11/07/2016, 4:12 PM

## 2016-11-07 NOTE — Progress Notes (Signed)
PT Cancellation Note  Patient Details Name: Rose FillersKaren Sporrer MRN: [id number] DOB: Nov 27, 1950   Cancelled Treatment:    Reason Eval/Treat Not Completed: (P)  (RN performing history assessment, will return to perform PT intervention.  )   Florestine Aversimee J Lamarr Feenstra 11/07/2016, 9:42 AM  Joycelyn RuaAimee Abbigaile Rockman, PTA pager [phone]

## 2016-11-07 NOTE — Progress Notes (Signed)
Physical Therapy Treatment Patient Details Name: Paula Poole MRN: [id number] DOB: 03-28-1951 Today's Date: 11/07/2016    History of Present Illness Pt is a 66 y/o female s/p R THA, direct anterior approach, secondary to R hip OA. PMH includes arthritis and depression.     PT Comments    Pt performed increased gait during session this am.  Pt progressed to stair training and standing exercises.  Moaning and grimacing throughout tx but tolerated activity well.   Will f/u in pm for PT session.  Plan to return home remains appropriate.     Follow Up Recommendations  DC plan and follow up therapy as arranged by surgeon;Supervision/Assistance - 24 hour     Equipment Recommendations  None recommended by PT    Recommendations for Other Services       Precautions / Restrictions Precautions Precautions: None Precaution Comments: Reviewed supine, seated and standing ther ex with pt from THA packet Restrictions Weight Bearing Restrictions: Yes RLE Weight Bearing: Weight bearing as tolerated    Mobility  Bed Mobility Overal bed mobility: Needs Assistance Bed Mobility: Supine to Sit     Supine to sit: Supervision;HOB elevated     General bed mobility comments: Increased time with use of rail, guarded due to pain.    Transfers Overall transfer level: Needs assistance Equipment used: Rolling walker (2 wheeled) Transfers: Sit to/from Stand Sit to Stand: Supervision         General transfer comment: Cues for hand placement and keeping RW close during transitions from sit to stand and stand to sit.  Pt required review of technique for safety.    Ambulation/Gait Ambulation/Gait assistance: Min guard Ambulation Distance (Feet): 80 Feet Assistive device: Rolling walker (2 wheeled) Gait Pattern/deviations: Step-through pattern;Decreased stride length;Trunk flexed;Antalgic Gait velocity: Decreased Gait velocity interpretation: Below normal speed for age/gender General Gait  Details: Cues for gait symmetry and upper trunk control.  Pt remains slow and guarded due to pain but no LOB noted.  Pt required cues for turning and backing.     Stairs Stairs: Yes   Stair Management: Two rails;No rails;Forwards;With walker Number of Stairs: 4 (x2 trials of curb training and x 2 stairs with B rails.  ) General stair comments: Curb training:  cues for sequencing and RW placement.  Stair training: Cues for hand placement on railing and sequencing.    Wheelchair Mobility    Modified Rankin (Stroke Patients Only)       Balance     Sitting balance-Leahy Scale: Good       Standing balance-Leahy Scale: Fair                              Cognition Arousal/Alertness: Awake/alert Behavior During Therapy: Anxious;WFL for tasks assessed/performed Overall Cognitive Status: Within Functional Limits for tasks assessed                                        Exercises Total Joint Exercises Ankle Circles/Pumps: AROM;Both;10 reps;Supine Quad Sets: AROM;Right;10 reps;Supine Short Arc Quad: AROM;Right;10 reps;Supine Heel Slides: AROM;Right;10 reps;Supine Hip ABduction/ADduction: AROM;Right;10 reps;Supine;20 reps;Standing (1x10 supine and 1x10 standing) Long Arc Quad: AROM;Right;10 reps;Supine Knee Flexion: AROM;Right;10 reps;Standing Marching in Standing: AROM;Right;10 reps;Standing Standing Hip Extension: AROM;Right;10 reps;Standing    General Comments        Pertinent Vitals/Pain Pain Assessment: 0-10 Pain Score: 8  Pain  Location: R Hip  Pain Descriptors / Indicators: Aching;Sore;Operative site guarding;Constant Pain Intervention(s): Monitored during session;Repositioned    Home Living Family/patient expects to be discharged to:: Private residence Living Arrangements: Other relatives                  Prior Function            PT Goals (current goals can now be found in the care plan section) Acute Rehab PT  Goals Patient Stated Goal: to go home  Potential to Achieve Goals: Good Progress towards PT goals: Progressing toward goals    Frequency    7X/week      PT Plan Current plan remains appropriate    Co-evaluation              AM-PAC PT "6 Clicks" Daily Activity  Outcome Measure  Difficulty turning over in bed (including adjusting bedclothes, sheets and blankets)?: A Little Difficulty moving from lying on back to sitting on the side of the bed? : A Little Difficulty sitting down on and standing up from a chair with arms (e.g., wheelchair, bedside commode, etc,.)?: A Little Help needed moving to and from a bed to chair (including a wheelchair)?: A Little Help needed walking in hospital room?: A Little Help needed climbing 3-5 steps with a railing? : A Little 6 Click Score: 18    End of Session Equipment Utilized During Treatment: Gait belt Activity Tolerance: Patient tolerated treatment well Patient left: in chair;with call bell/phone within reach Nurse Communication: Mobility status PT Visit Diagnosis: Other abnormalities of gait and mobility (R26.89);Pain Pain - Right/Left: Right     Time: 4132-44011023-1059 PT Time Calculation (min) (ACUTE ONLY): 36 min  Charges:  $Gait Training: 8-22 mins $Therapeutic Exercise: 8-22 mins                    G Codes:       Joycelyn Ruaimee Arvie Bartholomew, PTA pager [phone]    Florestine Aversimee J Emet Rafanan 11/07/2016, 10:59 AM

## 2016-11-07 NOTE — Progress Notes (Signed)
PATIENT ID: Paula Poole  MRN: [id number]  DOB/AGE:  Sep 25, 1950 / 66 y.o.  1 Day Post-Op Procedure(s) (LRB): TOTAL HIP ARTHROPLASTY ANTERIOR APPROACH (Right)    PROGRESS NOTE Subjective: Patient is alert, oriented, no Nausea, no Vomiting, yes passing gas, . Taking PO well. Denies SOB, Chest or Calf Pain. Using Incentive Spirometer, PAS in place. Ambulate 20' Patient reports pain as  4/10  .    Objective: Vital signs in last 24 hours: Vitals:   11/06/16 1035 11/06/16 1116 11/06/16 2109 11/07/16 0522  BP:  94/66 (!) 109/55 (!) 102/51  Pulse:  84 94 83  Resp:  18 18 17   Temp: 97.8 F (36.6 C) 98 F (36.7 C) 100 F (37.8 C) 98.6 F (37 C)  TempSrc:  Oral Oral Oral  SpO2:  99% 94% 97%      Intake/Output from previous day: I/O last 3 completed shifts: In: 1611.3 [P.O.:240; I.V.:1371.3] Out: 1000 [Urine:800; Blood:200]   Intake/Output this shift: Total I/O In: 1370 [P.O.:120; I.V.:1250] Out: -    LABORATORY DATA: No results for input(s): WBC, HGB, HCT, PLT, NA, K, CL, CO2, BUN, CREATININE, GLUCOSE, GLUCAP, INR, CALCIUM in the last 72 hours.  Invalid input(s): PT, 2  Examination: Neurologically intact ABD soft Neurovascular intact Sensation intact distally Intact pulses distally Dorsiflexion/Plantar flexion intact Incision: dressing C/D/I No cellulitis present Compartment soft} XR AP&Lat of hip shows well placed\fixed THA  Assessment:   1 Day Post-Op Procedure(s) (LRB): TOTAL HIP ARTHROPLASTY ANTERIOR APPROACH (Right) ADDITIONAL DIAGNOSIS:  Expected Acute Blood Loss Anemia,   Plan: PT/OT WBAT, THA  DVT Prophylaxis: SCDx72 hrs, ASA 325 mg BID x 2 weeks  DISCHARGE PLAN: Home, tomorrow  DISCHARGE NEEDS: HHPT, Walker and 3-in-1 comode seatPatient ID: Paula Poole, female   DOB: Sep 25, 1950, 66 y.o.   MRN: [id number]

## 2016-11-08 LAB — CBC
HCT: 30.6 % — ABNORMAL LOW (ref 36.0–46.0)
Hemoglobin: 9.8 g/dL — ABNORMAL LOW (ref 12.0–15.0)
MCH: 28.5 pg (ref 26.0–34.0)
MCHC: 32 g/dL (ref 30.0–36.0)
MCV: 89 fL (ref 78.0–100.0)
Platelets: 68 10*3/uL — ABNORMAL LOW (ref 150–400)
RBC: 3.44 MIL/uL — ABNORMAL LOW (ref 3.87–5.11)
RDW: 13.5 % (ref 11.5–15.5)
WBC: 13.7 10*3/uL — ABNORMAL HIGH (ref 4.0–10.5)

## 2016-11-08 NOTE — Progress Notes (Signed)
Physical Therapy Treatment Patient Details Name: Paula Poole MRN: [id number] DOB: [birth year] Today's Date: 11/08/2016    History of Present Illness Pt is a 66 y/o female s/p R THA, direct anterior approach, secondary to R hip OA. PMH includes arthritis and depression.     PT Comments    Pt performed increased gait and appears more painful this afternoon.  Pt remains motivated and ambulated with shoes on and reports improved support.  Pt to d/c home after d/c instructions from RN with HHPT to follow up.   Follow Up Recommendations  DC plan and follow up therapy as arranged by surgeon;Supervision/Assistance - 24 hour     Equipment Recommendations  None recommended by PT    Recommendations for Other Services       Precautions / Restrictions Precautions Precautions: None Precaution Comments: Pt educated on continuation of walking program to improve strength and endurance.   Restrictions Weight Bearing Restrictions: Yes RLE Weight Bearing: Weight bearing as tolerated    Mobility  Bed Mobility Overal bed mobility: Modified Independent Bed Mobility: Supine to Sit     Supine to sit: Modified independent (Device/Increase time)     General bed mobility comments: Increased time, no assistance needed.    Transfers Overall transfer level: Needs assistance Equipment used: Rolling walker (2 wheeled) Transfers: Sit to/from Stand Sit to Stand: Supervision         General transfer comment: Cues for hand placement and keeping RW close during transitions from sit to stand and stand to sit.  Pt required review of technique for safety.    Ambulation/Gait Ambulation/Gait assistance: Supervision Ambulation Distance (Feet): 600 Feet Assistive device: Rolling walker (2 wheeled) Gait Pattern/deviations: Step-through pattern;Antalgic Gait velocity: Decreased Gait velocity interpretation: Below normal speed for age/gender General Gait Details: Good technique with gait cues for RW safety  to keep hands on the RW when in motion.  Slightly antalgic during gait this afternoon as she reports increased pain.     Stairs            Wheelchair Mobility    Modified Rankin (Stroke Patients Only)       Balance Overall balance assessment: Needs assistance Sitting-balance support: No upper extremity supported;Feet supported Sitting balance-Leahy Scale: Good       Standing balance-Leahy Scale: Fair                              Cognition Arousal/Alertness: Awake/alert Behavior During Therapy: Anxious;WFL for tasks assessed/performed Overall Cognitive Status: Within Functional Limits for tasks assessed                                 General Comments: Pt remains anxious during session.        Exercises Total Joint Exercises Ankle Circles/Pumps: AROM;Both;10 reps;Supine Quad Sets: AROM;Right;10 reps;Supine Short Arc Quad: AROM;Right;10 reps;Supine Heel Slides: AROM;Right;10 reps;Supine Hip ABduction/ADduction: AROM;Right;10 reps;Standing Long Arc Quad: AROM;Right;10 reps;Supine Knee Flexion: AROM;Right;10 reps;Standing Marching in Standing: AROM;Right;10 reps;Standing Standing Hip Extension: AROM;Right;10 reps;Standing    General Comments        Pertinent Vitals/Pain Pain Assessment: 0-10 Pain Score: 8  Pain Location: R Hip  Pain Descriptors / Indicators: Aching;Sore;Operative site guarding;Constant Pain Intervention(s): Monitored during session;Patient requesting pain meds-RN notified;RN gave pain meds during session    Home Living  Prior Function            PT Goals (current goals can now be found in the care plan section) Acute Rehab PT Goals Patient Stated Goal: to go home  Potential to Achieve Goals: Good Progress towards PT goals: Progressing toward goals    Frequency    7X/week      PT Plan Current plan remains appropriate    Co-evaluation              AM-PAC PT "6  Clicks" Daily Activity  Outcome Measure  Difficulty turning over in bed (including adjusting bedclothes, sheets and blankets)?: None Difficulty moving from lying on back to sitting on the side of the bed? : None Difficulty sitting down on and standing up from a chair with arms (e.g., wheelchair, bedside commode, etc,.)?: A Little Help needed moving to and from a bed to chair (including a wheelchair)?: A Little Help needed walking in hospital room?: A Little Help needed climbing 3-5 steps with a railing? : A Little 6 Click Score: 20    End of Session Equipment Utilized During Treatment: Gait belt Activity Tolerance: Patient tolerated treatment well Patient left: in chair;with call bell/phone within reach Nurse Communication: Mobility status PT Visit Diagnosis: Other abnormalities of gait and mobility (R26.89);Pain Pain - Right/Left: Right Pain - part of body: Hip     Time: [id number] PT Time Calculation (min) (ACUTE ONLY): 15 min  Charges:  $Gait Training: 8-22 mins                    G Codes:       Paula Poole, PTA pager [phone]    Paula Poole 11/08/2016, 12:54 PM

## 2016-11-08 NOTE — Progress Notes (Signed)
PATIENT ID: Paula Poole  MRN: [id number]  DOB/AGE:  01-21-1951 / 66 y.o.  2 Days Post-Op Procedure(s) (LRB): TOTAL HIP ARTHROPLASTY ANTERIOR APPROACH (Right)    PROGRESS NOTE Subjective: Patient is alert, oriented, no Nausea, no Vomiting, yes passing gas, . Taking PO well. Denies SOB, Chest or Calf Pain. Using Incentive Spirometer, PAS in place. Ambulate WBAT with pt walking 300 ft with therapy Patient reports pain as  mild  .    Objective: Vital signs in last 24 hours: Vitals:   11/07/16 0951 11/07/16 1900 11/07/16 2209 11/08/16 0639  BP:  (!) 126/53 (!) 116/40 102/85  Pulse:  87 76 77  Resp:   16 16  Temp:  98.6 F (37 C) 98.2 F (36.8 C) 97.8 F (36.6 C)  TempSrc:   Oral Oral  SpO2:   95% 95%  Weight: 75.8 kg (167 lb 1.7 oz)     Height: 5' 5.5" (1.664 m)         Intake/Output from previous day: I/O last 3 completed shifts: In: 2474.7 [P.O.:775; I.V.:1699.7] Out: -    Intake/Output this shift: No intake/output data recorded.   LABORATORY DATA:  Recent Labs  11/07/16 0612 11/08/16 0422  WBC 7.5 13.7*  HGB 9.8* 9.8*  HCT 30.4* 30.6*  PLT 60* 68*  NA 137  --   K 4.0  --   CL 108  --   CO2 22  --   BUN 11  --   CREATININE 0.68  --   GLUCOSE 145*  --   CALCIUM 8.1*  --     Examination: Neurologically intact Neurovascular intact Sensation intact distally Intact pulses distally Dorsiflexion/Plantar flexion intact Incision: dressing C/D/I and scant drainage No cellulitis present Compartment soft} XR AP&Lat of hip shows well placed\fixed THA  Assessment:   2 Days Post-Op Procedure(s) (LRB): TOTAL HIP ARTHROPLASTY ANTERIOR APPROACH (Right) ADDITIONAL DIAGNOSIS:  Expected Acute Blood Loss Anemia,    Plan: PT/OT WBAT, THA  DVT Prophylaxis: SCDx72 hrs, ASA 325 mg BID x 2 weeks  DISCHARGE PLAN: Home  DISCHARGE NEEDS: HHPT, Walker and 3-in-1 comode seat

## 2016-11-08 NOTE — Care Management Note (Signed)
Case Management Note  Patient Details  Name: Paula Poole MRN: [id number] Date of Birth: 1950/08/27  Subjective/Objective:    66 yr old female s/p right total hip arthroplasty.                Action/Plan: Case manager spoke with patient concerning discharge plan and DME needs. Patient was preoepratively setup with [facility], no changes. She says she has RW, 3in1, grab bars in shower. Patient lives with her sister who is ill, neighbors will assist her at discharge.    Expected Discharge Date:  11/08/16               Expected Discharge Plan:  Home w Home Health Services  In-House Referral:  NA  Discharge planning Services  CM Consult  Post Acute Care Choice:  Home Health Choice offered to:  Patient  DME Arranged:  N/A (has RW and 3in1) DME Agency:  NA  HH Arranged:  PT HH Agency:  [facility] (formerly [facility])  Status of Service:  Completed, signed off  If discussed at [employer] Meetings, dates discussed:    Additional Comments:  Durenda GuthrieBrady, Paula Schweers [name], RN 11/08/2016, 10:33 AM

## 2016-11-08 NOTE — Progress Notes (Signed)
Physical Therapy Treatment Patient Details Name: Paula Poole MRN: [id number] DOB: 07-Aug-1950 Today's Date: 11/08/2016    History of Present Illness Pt is a 66 y/o female s/p R THA, direct anterior approach, secondary to R hip OA. PMH includes arthritis and depression.     PT Comments    Pt performed increased gait with more normalized pattern.  Pt remains to require cues for safety during mobility in regards to the RW.  Pt tolerated tx well and is ready for d/c home from a mobility stand point.  Will f/u in pm if patient remains hospitalized.      Follow Up Recommendations  DC plan and follow up therapy as arranged by surgeon;Supervision/Assistance - 24 hour     Equipment Recommendations  None recommended by PT    Recommendations for Other Services       Precautions / Restrictions Precautions Precautions: None Precaution Comments: Reviewed supine, seated and standing ther ex with pt from THA packet Restrictions Weight Bearing Restrictions: Yes RLE Weight Bearing: Weight bearing as tolerated    Mobility  Bed Mobility Overal bed mobility: Modified Independent Bed Mobility: Supine to Sit     Supine to sit: Modified independent (Device/Increase time)     General bed mobility comments: Increased time, no assistance needed.    Transfers Overall transfer level: Needs assistance Equipment used: Rolling walker (2 wheeled) Transfers: Sit to/from Stand Sit to Stand: Supervision         General transfer comment: Cues for hand placement and keeping RW close during transitions from sit to stand and stand to sit.  Pt required review of technique for safety.    Ambulation/Gait Ambulation/Gait assistance: Supervision Ambulation Distance (Feet): 450 Feet Assistive device: Rolling walker (2 wheeled) Gait Pattern/deviations: Step-through pattern Gait velocity: Decreased Gait velocity interpretation: Below normal speed for age/gender General Gait Details: Good technique with  gait cues for RW safety to keep hands on the RW when in motion.     Stairs            Wheelchair Mobility    Modified Rankin (Stroke Patients Only)       Balance Overall balance assessment: Needs assistance Sitting-balance support: No upper extremity supported;Feet supported Sitting balance-Leahy Scale: Good       Standing balance-Leahy Scale: Fair                              Cognition Arousal/Alertness: Awake/alert Behavior During Therapy: Anxious;WFL for tasks assessed/performed Overall Cognitive Status: Within Functional Limits for tasks assessed                                 General Comments: Pt remains anxious during session.        Exercises Total Joint Exercises Ankle Circles/Pumps: AROM;Both;10 reps;Supine Quad Sets: AROM;Right;10 reps;Supine Short Arc Quad: AROM;Right;10 reps;Supine Heel Slides: AROM;Right;10 reps;Supine Hip ABduction/ADduction: AROM;Right;10 reps;Supine;20 reps;Standing (1x10 in standing and 1x10 in supine) Long Arc Quad: AROM;Right;10 reps;Supine Knee Flexion: AROM;Right;10 reps;Standing Marching in Standing: AROM;Right;10 reps;Standing Standing Hip Extension: AROM;Right;10 reps;Standing    General Comments        Pertinent Vitals/Pain Pain Assessment: 0-10 Pain Score: 8  Pain Location: R Hip  Pain Descriptors / Indicators: Aching;Sore;Operative site guarding;Constant Pain Intervention(s): Monitored during session;Repositioned    Home Living  Prior Function            PT Goals (current goals can now be found in the care plan section) Acute Rehab PT Goals Patient Stated Goal: to go home  Potential to Achieve Goals: Good Progress towards PT goals: Progressing toward goals    Frequency    7X/week      PT Plan Current plan remains appropriate    Co-evaluation              AM-PAC PT "6 Clicks" Daily Activity  Outcome Measure  Difficulty turning  over in bed (including adjusting bedclothes, sheets and blankets)?: None Difficulty moving from lying on back to sitting on the side of the bed? : None Difficulty sitting down on and standing up from a chair with arms (e.g., wheelchair, bedside commode, etc,.)?: A Little Help needed moving to and from a bed to chair (including a wheelchair)?: A Little Help needed walking in hospital room?: A Little Help needed climbing 3-5 steps with a railing? : A Little 6 Click Score: 20    End of Session Equipment Utilized During Treatment: Gait belt Activity Tolerance: Patient tolerated treatment well Patient left: in chair;with call bell/phone within reach Nurse Communication: Mobility status PT Visit Diagnosis: Other abnormalities of gait and mobility (R26.89);Pain Pain - Right/Left: Right Pain - part of body: Hip     Time: [id number] PT Time Calculation (min) (ACUTE ONLY): 20 min  Charges:  $Gait Training: 8-22 mins                    G Codes:       Paula Poole, PTA pager [phone]    Paula Poole 11/08/2016, 9:44 AM

## 2016-11-08 NOTE — Discharge Summary (Signed)
Patient ID: Paula Poole MRN: [id number] DOB/AGE: [birth year] 66 y.o.  Admit date: 11/06/2016 Discharge date: 11/08/2016  Admission Diagnoses:  Principal Problem:   Primary localized osteoarthritis of right hip Active Problems:   Primary osteoarthritis of right hip   Discharge Diagnoses:  Same  Past Medical History:  Diagnosis Date  . Anemia    as a child  . Arthritis   . Depression    due to death of husband a year ago  . GERD (gastroesophageal reflux disease)    not for years  . High cholesterol   . Pneumonia     Surgeries: Procedure(s): TOTAL HIP ARTHROPLASTY ANTERIOR APPROACH on 11/06/2016   Consultants:   Discharged Condition: Improved  Hospital Course: Paula Poole is an 66 y.o. female who was admitted 11/06/2016 for operative treatment ofPrimary localized osteoarthritis of right hip. Patient has severe unremitting pain that affects sleep, daily activities, and work/hobbies. After pre-op clearance the patient was taken to the operating room on 11/06/2016 and underwent  Procedure(s): TOTAL HIP ARTHROPLASTY ANTERIOR APPROACH.    Patient was given perioperative antibiotics: Anti-infectives    Start     Dose/Rate Route Frequency Ordered Stop   11/06/16 0700  ceFAZolin (ANCEF) IVPB 2g/100 mL premix     2 g 200 mL/hr over 30 Minutes Intravenous To [facility] 11/03/16 0924 11/06/16 0757       Patient was given sequential compression devices, early ambulation, and chemoprophylaxis to prevent DVT.  Patient benefited maximally from hospital stay and there were no complications.    Recent vital signs: Patient Vitals for the past 24 hrs:  BP Temp Temp src Pulse Resp SpO2 Height Weight  11/08/16 0639 102/85 97.8 F (36.6 C) Oral 77 16 95 % - -  11/07/16 2209 (!) 116/40 98.2 F (36.8 C) Oral 76 16 95 % - -  11/07/16 1900 (!) 126/53 98.6 F (37 C) - 87 - - - -  11/07/16 4098 - - - - - - 5' 5.5" (1.664 m) 75.8 kg (167 lb 1.7 oz)     Recent laboratory studies:  Recent  Labs  11/07/16 0612 11/08/16 0422  WBC 7.5 13.7*  HGB 9.8* 9.8*  HCT 30.4* 30.6*  PLT 60* 68*  NA 137  --   K 4.0  --   CL 108  --   CO2 22  --   BUN 11  --   CREATININE 0.68  --   GLUCOSE 145*  --   CALCIUM 8.1*  --      Discharge Medications:   Allergies as of 11/08/2016      Reactions   Nickel Hives   Cheap jewelry makes her itchy   Latex Itching      Medication List    STOP taking these medications   naproxen sodium 220 MG tablet Commonly known as:  ANAPROX   predniSONE 10 MG tablet Commonly known as:  DELTASONE     TAKE these medications   aspirin EC 325 MG tablet Take 1 tablet (325 mg total) by mouth 2 (two) times daily.   [employer] PRENATAL GUMMY PO Take 1 tablet by mouth daily.   hydrOXYzine 25 MG capsule Commonly known as:  VISTARIL Take 50 mg by mouth at bedtime.   methocarbamol 500 MG tablet Commonly known as:  ROBAXIN Take 1 tablet (500 mg total) by mouth 2 (two) times daily with a meal.   oxyCODONE-acetaminophen 5-325 MG tablet Commonly known as:  ROXICET Take 1 tablet by mouth every 4 (four)  hours as needed.   sertraline 100 MG tablet Commonly known as:  ZOLOFT Take 100 mg by mouth daily.   traZODone 50 MG tablet Commonly known as:  DESYREL Take 50 mg by mouth daily.   vitamin C 500 MG tablet Commonly known as:  ASCORBIC ACID Take 500 mg by mouth daily.            Durable Medical Equipment        Start     Ordered   11/06/16 1100  DME Walker rolling  Once    Question:  Patient needs a walker to treat with the following condition  Answer:  Status post right hip replacement   11/06/16 1059   11/06/16 1100  DME 3 n 1  Once     11/06/16 1059   11/06/16 1100  DME Bedside commode  Once    Question:  Patient needs a bedside commode to treat with the following condition  Answer:  Status post right hip replacement   11/06/16 1059      Diagnostic Studies: Dg Chest 2 View  Result Date: 10/26/2016 CLINICAL DATA:  Preop chest x-ray  EXAM: CHEST  2 VIEW COMPARISON:  No prior . FINDINGS: Mediastinum and hilar structures normal. Heart size normal. No focal infiltrate . Mild left base pleural thickening consistent with small pleural effusion and/or scarring. IMPRESSION: Mild left base pleural thickening noted consistent with small pleural effusion and/or scarring. Exam otherwise negative. Electronically Signed   By: Maisie Fus  Register   On: 10/26/2016 11:09   Dg C-arm 1-60 Min  Result Date: 11/06/2016 CLINICAL DATA:  Right hip replacement. EXAM: OPERATIVE RIGHT HIP (WITH PELVIS IF PERFORMED) 2 VIEWS TECHNIQUE: Fluoroscopic spot image(s) were submitted for interpretation post-operatively. COMPARISON:  No prior . FINDINGS: Total right hip replacement. Anatomic alignment. Hardware intact. 0 minutes 11 seconds fluoroscopy time. IMPRESSION: Total right hip replacement with anatomic alignment. Electronically Signed   By: Maisie Fus  Register   On: 11/06/2016 09:10   Dg Hip Operative Unilat W Or W/o Pelvis Right  Result Date: 11/06/2016 CLINICAL DATA:  Right hip replacement. EXAM: OPERATIVE RIGHT HIP (WITH PELVIS IF PERFORMED) 2 VIEWS TECHNIQUE: Fluoroscopic spot image(s) were submitted for interpretation post-operatively. COMPARISON:  No prior . FINDINGS: Total right hip replacement. Anatomic alignment. Hardware intact. 0 minutes 11 seconds fluoroscopy time. IMPRESSION: Total right hip replacement with anatomic alignment. Electronically Signed   By: Maisie Fus  Register   On: 11/06/2016 09:10    Disposition:   Discharge Instructions    Call MD / Call 911    Complete by:  As directed    If you experience chest pain or shortness of breath, CALL 911 and be transported to the hospital emergency room.  If you develope a fever above 101 F, pus (white drainage) or increased drainage or redness at the wound, or calf pain, call your surgeon's office.   Constipation Prevention    Complete by:  As directed    Drink plenty of fluids.  Prune juice may be  helpful.  You may use a stool softener, such as Colace (over the counter) 100 mg twice a day.  Use MiraLax (over the counter) for constipation as needed.   Diet - low sodium heart healthy    Complete by:  As directed    Driving restrictions    Complete by:  As directed    No driving for 2 weeks   Follow the hip precautions as taught in Physical Therapy    Complete by:  As directed    Increase activity slowly as tolerated    Complete by:  As directed    Patient may shower    Complete by:  As directed    You may shower without a dressing once there is no drainage.  Do not wash over the wound.  If drainage remains, cover wound with plastic wrap and then shower.      Follow-up Information    Gean Birchwoodowan, Frank, MD Follow up in 2 week(s).   Specialty:  Orthopedic Surgery Contact information: [address] [place] [address] [phone]            Signed: Vear ClockHILLIPS, Artavia Jeanlouis R 11/08/2016, 7:55 AM

## 2017-08-06 DIAGNOSIS — R69 Illness, unspecified: Secondary | ICD-10-CM | POA: Diagnosis not present

## 2017-08-13 DIAGNOSIS — R69 Illness, unspecified: Secondary | ICD-10-CM | POA: Diagnosis not present

## 2017-08-17 DIAGNOSIS — R69 Illness, unspecified: Secondary | ICD-10-CM | POA: Diagnosis not present

## 2017-08-20 DIAGNOSIS — R69 Illness, unspecified: Secondary | ICD-10-CM | POA: Diagnosis not present

## 2017-08-23 DIAGNOSIS — M25551 Pain in right hip: Secondary | ICD-10-CM | POA: Diagnosis not present

## 2017-08-27 DIAGNOSIS — R69 Illness, unspecified: Secondary | ICD-10-CM | POA: Diagnosis not present

## 2017-09-03 DIAGNOSIS — R69 Illness, unspecified: Secondary | ICD-10-CM | POA: Diagnosis not present

## 2017-09-06 DIAGNOSIS — R69 Illness, unspecified: Secondary | ICD-10-CM | POA: Diagnosis not present

## 2017-09-10 DIAGNOSIS — R69 Illness, unspecified: Secondary | ICD-10-CM | POA: Diagnosis not present

## 2017-09-17 DIAGNOSIS — R69 Illness, unspecified: Secondary | ICD-10-CM | POA: Diagnosis not present

## 2017-09-24 DIAGNOSIS — R69 Illness, unspecified: Secondary | ICD-10-CM | POA: Diagnosis not present

## 2017-10-12 DIAGNOSIS — R69 Illness, unspecified: Secondary | ICD-10-CM | POA: Diagnosis not present

## 2017-10-15 DIAGNOSIS — R69 Illness, unspecified: Secondary | ICD-10-CM | POA: Diagnosis not present

## 2017-10-25 DIAGNOSIS — R69 Illness, unspecified: Secondary | ICD-10-CM | POA: Diagnosis not present

## 2017-10-26 ENCOUNTER — Other Ambulatory Visit: Payer: Self-pay | Admitting: Endocrinology

## 2017-10-26 DIAGNOSIS — E7849 Other hyperlipidemia: Secondary | ICD-10-CM | POA: Diagnosis not present

## 2017-10-26 DIAGNOSIS — Z1231 Encounter for screening mammogram for malignant neoplasm of breast: Secondary | ICD-10-CM

## 2017-10-29 DIAGNOSIS — R635 Abnormal weight gain: Secondary | ICD-10-CM | POA: Diagnosis not present

## 2017-10-29 DIAGNOSIS — E7849 Other hyperlipidemia: Secondary | ICD-10-CM | POA: Diagnosis not present

## 2017-10-29 DIAGNOSIS — Z0000 Encounter for general adult medical examination without abnormal findings: Secondary | ICD-10-CM | POA: Diagnosis not present

## 2017-10-29 DIAGNOSIS — Z6829 Body mass index (BMI) 29.0-29.9, adult: Secondary | ICD-10-CM | POA: Diagnosis not present

## 2017-10-29 DIAGNOSIS — R69 Illness, unspecified: Secondary | ICD-10-CM | POA: Diagnosis not present

## 2017-10-29 DIAGNOSIS — Z800 Family history of malignant neoplasm of digestive organs: Secondary | ICD-10-CM | POA: Diagnosis not present

## 2017-10-30 DIAGNOSIS — Z1212 Encounter for screening for malignant neoplasm of rectum: Secondary | ICD-10-CM | POA: Diagnosis not present

## 2017-11-05 DIAGNOSIS — R69 Illness, unspecified: Secondary | ICD-10-CM | POA: Diagnosis not present

## 2017-11-12 DIAGNOSIS — R69 Illness, unspecified: Secondary | ICD-10-CM | POA: Diagnosis not present

## 2017-11-16 ENCOUNTER — Ambulatory Visit
Admission: RE | Admit: 2017-11-16 | Discharge: 2017-11-16 | Disposition: A | Discharge status: Home / Self Care | Payer: BLUE CROSS/BLUE SHIELD | Source: Ambulatory Visit | Attending: Endocrinology | Admitting: Endocrinology

## 2017-11-16 DIAGNOSIS — Z1231 Encounter for screening mammogram for malignant neoplasm of breast: Secondary | ICD-10-CM | POA: Diagnosis not present

## 2017-11-21 DIAGNOSIS — M1712 Unilateral primary osteoarthritis, left knee: Secondary | ICD-10-CM | POA: Diagnosis not present

## 2017-11-21 DIAGNOSIS — M1711 Unilateral primary osteoarthritis, right knee: Secondary | ICD-10-CM | POA: Diagnosis not present

## 2017-11-30 ENCOUNTER — Other Ambulatory Visit: Payer: Self-pay | Admitting: Orthopedic Surgery

## 2017-12-03 DIAGNOSIS — R69 Illness, unspecified: Secondary | ICD-10-CM | POA: Diagnosis not present

## 2017-12-07 NOTE — Patient Instructions (Addendum)
Paula FillersKaren Poole  12/07/2017   Your procedure is scheduled on: 12-17-17   [facility]    Report to Admitting at 5:30 AM    Call this number if you have problems the morning of surgery [phone]   Remember: Do not eat food or drink liquids :After Midnight.     Take these medicines the morning of surgery with A SIP OF WATER: Sertraline (Zoloft).  You may bring and use your eyedrops as needed.              You may not have any metal on your body including hair pins and              piercings  Do not wear jewelry, make-up, lotions, powders or perfumes, deodorant             Do not wear nail polish.  Do not shave  48 hours prior to surgery.                Do not bring valuables to the hospital. [place] IS NOT             RESPONSIBLE   FOR VALUABLES.  Contacts, dentures or bridgework may not be worn into surgery.  Leave suitcase in the car. After surgery it may be brought to your room.      Special Instructions: N/A              Please read over the following fact sheets you were given: _____________________________________________________________________             [facility] Before surgery, you can play an important role.  Because skin is not sterile, your skin needs to be as free of germs as possible.  You can reduce the number of germs on your skin by washing with CHG (chlorahexidine gluconate) soap before surgery.  CHG is an antiseptic cleaner which kills germs and bonds with the skin to continue killing germs even after washing. Please DO NOT use if you have an allergy to CHG or antibacterial soaps.  If your skin becomes reddened/irritated stop using the CHG and inform your nurse when you arrive at Short Stay. Do not shave (including legs and underarms) for at least 48 hours prior to the first CHG shower.  You may shave your face/neck. Please follow these instructions carefully:  1.  Shower with CHG  Soap the night before surgery and the  morning of Surgery.  2.  If you choose to wash your hair, wash your hair first as usual with your  normal  shampoo.  3.  After you shampoo, rinse your hair and body thoroughly to remove the  shampoo.                           4.  Use CHG as you would any other liquid soap.  You can apply chg directly  to the skin and wash                       Gently with a scrungie or clean washcloth.  5.  Apply the CHG Soap to your body ONLY FROM THE NECK DOWN.   Do not use on face/ open  Wound or open sores. Avoid contact with eyes, ears mouth and genitals (private parts).                       Wash face,  Genitals (private parts) with your normal soap.             6.  Wash thoroughly, paying special attention to the area where your surgery  will be performed.  7.  Thoroughly rinse your body with warm water from the neck down.  8.  DO NOT shower/wash with your normal soap after using and rinsing off  the CHG Soap.                9.  Pat yourself dry with a clean towel.            10.  Wear clean pajamas.            11.  Place clean sheets on your bed the night of your first shower and do not  sleep with pets. Day of Surgery : Do not apply any lotions/deodorants the morning of surgery.  Please wear clean clothes to the hospital/surgery center.  FAILURE TO FOLLOW THESE INSTRUCTIONS MAY RESULT IN THE CANCELLATION OF YOUR SURGERY PATIENT SIGNATURE_________________________________  NURSE SIGNATURE__________________________________  ________________________________________________________________________   Paula Poole  An incentive spirometer is a tool that can help keep your lungs clear and active. This tool measures how well you are filling your lungs with each breath. Taking long deep breaths may help reverse or decrease the chance of developing breathing (pulmonary) problems (especially infection) following:  A long period of time  when you are unable to move or be active. BEFORE THE PROCEDURE   If the spirometer includes an indicator to show your best effort, your nurse or respiratory therapist will set it to a desired goal.  If possible, sit up straight or lean slightly forward. Try not to slouch.  Hold the incentive spirometer in an upright position. INSTRUCTIONS FOR USE  1. Sit on the edge of your bed if possible, or sit up as far as you can in bed or on a chair. 2. Hold the incentive spirometer in an upright position. 3. Breathe out normally. 4. Place the mouthpiece in your mouth and seal your lips tightly around it. 5. Breathe in slowly and as deeply as possible, raising the piston or the ball toward the top of the column. 6. Hold your breath for 3-5 seconds or for as long as possible. Allow the piston or ball to fall to the bottom of the column. 7. Remove the mouthpiece from your mouth and breathe out normally. 8. Rest for a few seconds and repeat Steps 1 through 7 at least 10 times every 1-2 hours when you are awake. Take your time and take a few normal breaths between deep breaths. 9. The spirometer may include an indicator to show your best effort. Use the indicator as a goal to work toward during each repetition. 10. After each set of 10 deep breaths, practice coughing to be sure your lungs are clear. If you have an incision (the cut made at the time of surgery), support your incision when coughing by placing a pillow or rolled up towels firmly against it. Once you are able to get out of bed, walk around indoors and cough well. You may stop using the incentive spirometer when instructed by your caregiver.  RISKS AND COMPLICATIONS  Take your time so you do not get  dizzy or light-headed.  If you are in pain, you may need to take or ask for pain medication before doing incentive spirometry. It is harder to take a deep breath if you are having pain. AFTER USE  Rest and breathe slowly and easily.  It can be  helpful to keep track of a log of your progress. Your caregiver can provide you with a simple table to help with this. If you are using the spirometer at home, follow these instructions: [place] IF:   You are having difficultly using the spirometer.  You have trouble using the spirometer as often as instructed.  Your pain medication is not giving enough relief while using the spirometer.  You develop fever of 100.5 F (38.1 C) or higher. SEEK IMMEDIATE MEDICAL CARE IF:   You cough up bloody sputum that had not been present before.  You develop fever of 102 F (38.9 C) or greater.  You develop worsening pain at or near the incision site. MAKE SURE YOU:   Understand these instructions.  Will watch your condition.  Will get help right away if you are not doing well or get worse. Document Released: 09/04/2006 Document Revised: 07/17/2011 Document Reviewed: 11/05/2006 Exit[employer] Patient Information 2014 ExitCare, [place].   ________________________________________________________________________  WHAT IS A BLOOD TRANSFUSION? Blood Transfusion Information  A transfusion is the replacement of blood or some of its parts. Blood is made up of multiple cells which provide different functions.  Red blood cells carry oxygen and are used for blood loss replacement.  White blood cells fight against infection.  Platelets control bleeding.  Plasma helps clot blood.  Other blood products are available for specialized needs, such as hemophilia or other clotting disorders. BEFORE THE TRANSFUSION  Who gives blood for transfusions?   Healthy volunteers who are fully evaluated to make sure their blood is safe. This is blood bank blood. Transfusion therapy is the safest it has ever been in the practice of medicine. Before blood is taken from a donor, a complete history is taken to make sure that person has no history of diseases nor engages in risky social behavior (examples are  intravenous drug use or sexual activity with multiple partners). The donor's travel history is screened to minimize risk of transmitting infections, such as malaria. The donated blood is tested for signs of infectious diseases, such as HIV and hepatitis. The blood is then tested to be sure it is compatible with you in order to minimize the chance of a transfusion reaction. If you or a relative donates blood, this is often done in anticipation of surgery and is not appropriate for emergency situations. It takes many days to process the donated blood. RISKS AND COMPLICATIONS Although transfusion therapy is very safe and saves many lives, the main dangers of transfusion include:   Getting an infectious disease.  Developing a transfusion reaction. This is an allergic reaction to something in the blood you were given. Every precaution is taken to prevent this. The decision to have a blood transfusion has been considered carefully by your caregiver before blood is given. Blood is not given unless the benefits outweigh the risks. AFTER THE TRANSFUSION  Right after receiving a blood transfusion, you will usually feel much better and more energetic. This is especially true if your red blood cells have gotten low (anemic). The transfusion raises the level of the red blood cells which carry oxygen, and this usually causes an energy increase.  The nurse administering the transfusion will  monitor you carefully for complications. HOME CARE INSTRUCTIONS  No special instructions are needed after a transfusion. You may find your energy is better. Speak with your caregiver about any limitations on activity for underlying diseases you may have. SEEK MEDICAL CARE IF:   Your condition is not improving after your transfusion.  You develop redness or irritation at the intravenous (IV) site. SEEK IMMEDIATE MEDICAL CARE IF:  Any of the following symptoms occur over the next 12 hours:  Shaking chills.  You have a  temperature by mouth above 102 F (38.9 C), not controlled by medicine.  Chest, back, or muscle pain.  People around you feel you are not acting correctly or are confused.  Shortness of breath or difficulty breathing.  Dizziness and fainting.  You get a rash or develop hives.  You have a decrease in urine output.  Your urine turns a dark color or changes to pink, red, or brown. Any of the following symptoms occur over the next 10 days:  You have a temperature by mouth above 102 F (38.9 C), not controlled by medicine.  Shortness of breath.  Weakness after normal activity.  The white part of the eye turns yellow (jaundice).  You have a decrease in the amount of urine or are urinating less often.  Your urine turns a dark color or changes to pink, red, or brown. Document Released: 04/21/2000 Document Revised: 07/17/2011 Document Reviewed: 12/09/2007 [facility] Patient Information 2014 [place], [place].  _______________________________________________________________________

## 2017-12-10 ENCOUNTER — Encounter (HOSPITAL_COMMUNITY)
Admission: RE | Admit: 2017-12-10 | Discharge: 2017-12-10 | Disposition: A | Discharge status: Home / Self Care | Payer: Medicare HMO | Source: Ambulatory Visit | Attending: Orthopedic Surgery | Admitting: Orthopedic Surgery

## 2017-12-10 ENCOUNTER — Encounter (HOSPITAL_COMMUNITY): Payer: Self-pay

## 2017-12-10 ENCOUNTER — Ambulatory Visit (HOSPITAL_COMMUNITY)
Admission: RE | Admit: 2017-12-10 | Discharge: 2017-12-10 | Disposition: A | Discharge status: Home / Self Care | Payer: Medicare HMO | Source: Ambulatory Visit | Attending: Orthopedic Surgery | Admitting: Orthopedic Surgery

## 2017-12-10 ENCOUNTER — Other Ambulatory Visit: Payer: Self-pay

## 2017-12-10 DIAGNOSIS — Z01810 Encounter for preprocedural cardiovascular examination: Secondary | ICD-10-CM | POA: Insufficient documentation

## 2017-12-10 DIAGNOSIS — I700 Atherosclerosis of aorta: Secondary | ICD-10-CM | POA: Diagnosis not present

## 2017-12-10 DIAGNOSIS — Z01812 Encounter for preprocedural laboratory examination: Secondary | ICD-10-CM | POA: Diagnosis not present

## 2017-12-10 DIAGNOSIS — M1711 Unilateral primary osteoarthritis, right knee: Secondary | ICD-10-CM | POA: Diagnosis not present

## 2017-12-10 DIAGNOSIS — Z01818 Encounter for other preprocedural examination: Secondary | ICD-10-CM | POA: Insufficient documentation

## 2017-12-10 LAB — BASIC METABOLIC PANEL
Anion gap: 9 (ref 5–15)
BUN: 22 mg/dL (ref 8–23)
CO2: 24 mmol/L (ref 22–32)
Calcium: 9.6 mg/dL (ref 8.9–10.3)
Chloride: 107 mmol/L (ref 98–111)
Creatinine, Ser: 0.67 mg/dL (ref 0.44–1.00)
GFR calc Af Amer: >60 mL/min (ref >60)
GFR calc non Af Amer: >60 mL/min (ref >60)
Glucose, Bld: 115 mg/dL — ABNORMAL HIGH (ref 70–99)
Potassium: 4.6 mmol/L (ref 3.5–5.1)
Sodium: 140 mmol/L (ref 135–145)

## 2017-12-10 LAB — CBC WITH DIFFERENTIAL/PLATELET
Basophils Absolute: 0 10*3/uL (ref 0.0–0.1)
Basophils Relative: 0 %
Eosinophils Absolute: 0.2 10*3/uL (ref 0.0–0.7)
Eosinophils Relative: 3 %
HCT: 41.5 % (ref 36.0–46.0)
Hemoglobin: 13.5 g/dL (ref 12.0–15.0)
Lymphocytes Relative: 21 %
Lymphs Abs: 1.4 10*3/uL (ref 0.7–4.0)
MCH: 28.7 pg (ref 26.0–34.0)
MCHC: 32.5 g/dL (ref 30.0–36.0)
MCV: 88.1 fL (ref 78.0–100.0)
Monocytes Absolute: 0.5 10*3/uL (ref 0.1–1.0)
Monocytes Relative: 8 %
Neutro Abs: 4.3 10*3/uL (ref 1.7–7.7)
Neutrophils Relative %: 68 %
Platelets: 138 10*3/uL — ABNORMAL LOW (ref 150–400)
RBC: 4.71 MIL/uL (ref 3.87–5.11)
RDW: 12.5 % (ref 11.5–15.5)
WBC: 6.4 10*3/uL (ref 4.0–10.5)

## 2017-12-10 LAB — URINALYSIS, ROUTINE W REFLEX MICROSCOPIC
Bilirubin Urine: NEGATIVE
Glucose, UA: NEGATIVE mg/dL
Hgb urine dipstick: NEGATIVE
Ketones, ur: NEGATIVE mg/dL
Nitrite: NEGATIVE
Protein, ur: NEGATIVE mg/dL
Specific Gravity, Urine: 1.021 (ref 1.005–1.030)
WBC, UA: >50 WBC/hpf — ABNORMAL HIGH (ref 0–5)
pH: 5 (ref 5.0–8.0)

## 2017-12-10 LAB — APTT: aPTT: 35 seconds (ref 24–36)

## 2017-12-10 LAB — SURGICAL PCR SCREEN
MRSA, PCR: NEGATIVE
Staphylococcus aureus: NEGATIVE

## 2017-12-10 LAB — PROTIME-INR
INR: 1
Prothrombin Time: 13.1 seconds (ref 11.4–15.2)

## 2017-12-10 LAB — ABO/RH: ABO/RH(D): A POS

## 2017-12-14 DIAGNOSIS — M1711 Unilateral primary osteoarthritis, right knee: Secondary | ICD-10-CM | POA: Diagnosis present

## 2017-12-14 NOTE — H&P (Signed)
TOTAL KNEE ADMISSION H&P  Patient is being admitted for right total knee arthroplasty.  Subjective:  Chief Complaint:right knee pain.  HPI: Paula Poole, 67 y.o. female, has a history of pain and functional disability in the right knee due to arthritis and has failed non-surgical conservative treatments for greater than 12 weeks to includeNSAID's and/or analgesics, corticosteriod injections and activity modification.  Onset of symptoms was gradual, starting 2 years ago with gradually worsening course since that time. The patient noted no past surgery on the right knee(s).  Patient currently rates pain in the right knee(s) at 10 out of 10 with activity. Patient has night pain, worsening of pain with activity and weight bearing, pain that interferes with activities of daily living and pain with passive range of motion.  Patient has evidence of joint space narrowing by imaging studies.  There is no active infection.  Patient Active Problem List   Diagnosis Date Noted  . Primary localized osteoarthritis of right hip 11/03/2016    Priority: High  . Primary osteoarthritis of right hip 11/06/2016   Past Medical History:  Diagnosis Date  . Anemia    as a child  . Arthritis   . Depression    due to death of husband a year ago  . GERD (gastroesophageal reflux disease)    not for years  . High cholesterol   . Pneumonia     Past Surgical History:  Procedure Laterality Date  . ABDOMINAL HYSTERECTOMY    . BREAST BIOPSY  2008   (-) results per patient  . TONSILLECTOMY    . TOTAL HIP ARTHROPLASTY Right 11/06/2016  . TOTAL HIP ARTHROPLASTY Right 11/06/2016   Procedure: TOTAL HIP ARTHROPLASTY ANTERIOR APPROACH;  Surgeon: Gean Birchwoodowan, Frank, MD;  Location: [facility] OR;  Service: Orthopedics;  Laterality: Right;    No current facility-administered medications for this encounter.    Current Outpatient Medications  Medication Sig Dispense Refill Last Dose  . amoxicillin (AMOXIL) 500 MG capsule Take 2,000 mg by  mouth See admin instructions. Take 4 capsules (2000 mg) by mouth 1 hour prior to dental appointment.  99   . gemfibrozil (LOPID) 600 MG tablet Take 600 mg by mouth at bedtime.  3   . hydrOXYzine (VISTARIL) 25 MG capsule Take 50 mg by mouth at bedtime.  1 11/05/2016 at Unknown time  . meloxicam (MOBIC) 15 MG tablet Take 15 mg by mouth daily.  2   . Multiple Vitamin (MULTIVITAMIN WITH MINERALS) TABS tablet Take 1 tablet by mouth daily. Multivitamin for Women     . Multiple Vitamins-Minerals (AIRBORNE) CHEW Chew 1 tablet by mouth daily.     . naproxen sodium (ALEVE) 220 MG tablet Take 220 mg by mouth 2 (two) times daily as needed (for pain.).     Marland Kitchen. OVER THE COUNTER MEDICATION Place 1 drop into both eyes daily as needed (for irritated ears). MIRACELL PROEAR      . sertraline (ZOLOFT) 100 MG tablet Take 100 mg by mouth daily.  0 11/06/2016 at 0440  . SYSTANE 0.4-0.3 % SOLN Place 1 drop into both eyes 3 (three) times daily as needed (for dry eyes.).     Marland Kitchen. traMADol (ULTRAM) 50 MG tablet Take 50 mg by mouth 2 (two) times daily.  0   . traZODone (DESYREL) 50 MG tablet Take 50 mg by mouth at bedtime.   1 11/05/2016 at Unknown time   Allergies  Allergen Reactions  . Nickel Hives    Cheap jewelry makes her itchy  .  Psychologist, sport and exercise  . Latex Itching    Social History   Tobacco Use  . Smoking status: Former Smoker    Last attempt to quit: 09/25/2016    Years since quitting: 1.2  . Smokeless tobacco: Never Used  Substance Use Topics  . Alcohol use: No    Family History  Problem Relation Age of Onset  . Breast cancer Sister 95  . Thyroid cancer Sister   . Lymphoma Sister   . Atrial fibrillation Sister   . CVA Mother   . Colon cancer Father   . Heart attack Father   . High Cholesterol Father   . Diabetes Sister   . High Cholesterol Sister      Review of Systems  Constitutional: Negative.   HENT: Negative.   Eyes: Negative.   Respiratory: Negative.   Cardiovascular: Negative.    Gastrointestinal: Negative.   Genitourinary: Negative.   Musculoskeletal: Positive for joint pain.  Skin: Negative.   Neurological: Negative.   Endo/Heme/Allergies: Negative.   Psychiatric/Behavioral: Positive for depression. The patient is nervous/anxious.     Objective:  Physical Exam  Constitutional: She is oriented to person, place, and time. She appears well-developed and well-nourished.  HENT:  Head: Normocephalic and atraumatic.  Eyes: Pupils are equal, round, and reactive to light.  Neck: Normal range of motion. Neck supple.  Cardiovascular: Intact distal pulses.  Respiratory: Effort normal.  Musculoskeletal: She exhibits tenderness and deformity.  The skin over the right knee is intact tender along the medial joint line.  Obvious varus deformity, right greater than left.  Range of motion 5/120 with discomfort.  She walks with a mildly antalgic gait, right greater than left.  Toes are pink and well perfused.  Neurovascular intact.  Good quadriceps and hamstring power.    Neurological: She is alert and oriented to person, place, and time.  Skin: Skin is warm and dry.  Psychiatric: She has a normal mood and affect. Her behavior is normal. Judgment and thought content normal.    Vital signs in last 24 hours:    Labs:   Estimated body mass index is 28.86 kg/m as calculated from the following:   Height as of 12/10/17: 5' 5.5" (1.664 m).   Weight as of 12/10/17: 79.9 kg.   Imaging Review Plain radiographs demonstrate bilateral knee x-rays taken on 05/10/17 show bilateral AP weightbearing, bilateral Zoila Shutter. Lateral sunrise views of the left knee are taken and reviewed in office today.  Patient's right knee does have bone-on-bone arthritis in the medial compartment. Patient's left knee has moderate arthritis of the medial compartment.   Preoperative templating of the joint replacement has been completed, documented, and submitted to the Operating Room personnel in order to  optimize intra-operative equipment management.   Anticipated LOS equal to or greater than 2 midnights due to - Age 65 and older with one or more of the following:  - Obesity  - Expected need for hospital services (PT, OT, Nursing) required for safe  discharge  - Anticipated need for postoperative skilled nursing care or inpatient rehab  - Active co-morbidities: anxiety and depression   Assessment/Plan:  End stage arthritis, right knee   The patient history, physical examination, clinical judgment of the provider and imaging studies are consistent with end stage degenerative joint disease of the right knee(s) and total knee arthroplasty is deemed medically necessary. The treatment options including medical management, injection therapy arthroscopy and arthroplasty were discussed at length. The risks and benefits of total knee arthroplasty  were presented and reviewed. The risks due to aseptic loosening, infection, stiffness, patella tracking problems, thromboembolic complications and other imponderables were discussed. The patient acknowledged the explanation, agreed to proceed with the plan and consent was signed. Patient is being admitted for inpatient treatment for surgery, pain control, PT, OT, prophylactic antibiotics, VTE prophylaxis, progressive ambulation and ADL's and discharge planning. The patient is planning to be discharged home with home health services.

## 2017-12-16 ENCOUNTER — Encounter (HOSPITAL_COMMUNITY): Payer: Self-pay | Admitting: Anesthesiology

## 2017-12-16 MED ORDER — BUPIVACAINE LIPOSOME 1.3 % IJ SUSP
20.0000 mL | Freq: Once | INTRAMUSCULAR | Status: DC
  Filled 2017-12-16: qty 20

## 2017-12-16 MED ORDER — TRANEXAMIC ACID 1000 MG/10ML IV SOLN
2000.0000 mg | INTRAVENOUS | Status: DC
  Filled 2017-12-16: qty 20

## 2017-12-16 NOTE — Anesthesia Preprocedure Evaluation (Addendum)
Anesthesia Evaluation  Patient identified by MRN, date of birth, ID band Patient awake    Reviewed: Allergy & Precautions, NPO status , Patient's Chart, lab work & pertinent test results  Airway Mallampati: I       Dental no notable dental hx. (+) Teeth Intact   Pulmonary former smoker,    Pulmonary exam normal breath sounds clear to auscultation       Cardiovascular negative cardio ROS Normal cardiovascular exam Rhythm:Regular Rate:Normal     Neuro/Psych PSYCHIATRIC DISORDERS Depression    GI/Hepatic Neg liver ROS,   Endo/Other  negative endocrine ROS  Renal/GU negative Renal ROS  negative genitourinary   Musculoskeletal   Abdominal Normal abdominal exam  (+)   Peds  Hematology   Anesthesia Other Findings   Reproductive/Obstetrics                            Anesthesia Physical  Anesthesia Plan  ASA: II  Anesthesia Plan: Spinal   Post-op Pain Management:  Regional for Post-op pain   Induction:   PONV Risk Score and Plan: 2 and Ondansetron and Dexamethasone  Airway Management Planned: Natural Airway, Nasal Cannula and Simple Face Mask  Additional Equipment:   Intra-op Plan:   Post-operative Plan:   Informed Consent: I have reviewed the patients History and Physical, chart, labs and discussed the procedure including the risks, benefits and alternatives for the proposed anesthesia with the patient or authorized representative who has indicated his/her understanding and acceptance.     Plan Discussed with: CRNA and Surgeon  Anesthesia Plan Comments:        Anesthesia Quick Evaluation

## 2017-12-17 ENCOUNTER — Other Ambulatory Visit: Payer: Self-pay

## 2017-12-17 ENCOUNTER — Ambulatory Visit (HOSPITAL_COMMUNITY): Payer: Medicare HMO | Admitting: Anesthesiology

## 2017-12-17 ENCOUNTER — Encounter (HOSPITAL_COMMUNITY): Payer: Self-pay | Admitting: Emergency Medicine

## 2017-12-17 ENCOUNTER — Observation Stay (HOSPITAL_COMMUNITY)
Admission: RE | Admit: 2017-12-17 | Discharge: 2017-12-18 | Disposition: A | Discharge status: Home / Self Care | Payer: Medicare HMO | Source: Ambulatory Visit | Attending: Orthopedic Surgery | Admitting: Orthopedic Surgery

## 2017-12-17 ENCOUNTER — Encounter (HOSPITAL_COMMUNITY)
Admission: RE | Disposition: A | Discharge status: Home / Self Care | Payer: Self-pay | Source: Ambulatory Visit | Attending: Orthopedic Surgery

## 2017-12-17 DIAGNOSIS — G8918 Other acute postprocedural pain: Secondary | ICD-10-CM | POA: Diagnosis not present

## 2017-12-17 DIAGNOSIS — Z91040 Latex allergy status: Secondary | ICD-10-CM | POA: Diagnosis not present

## 2017-12-17 DIAGNOSIS — F329 Major depressive disorder, single episode, unspecified: Secondary | ICD-10-CM | POA: Insufficient documentation

## 2017-12-17 DIAGNOSIS — E7800 Pure hypercholesterolemia, unspecified: Secondary | ICD-10-CM | POA: Diagnosis not present

## 2017-12-17 DIAGNOSIS — Z803 Family history of malignant neoplasm of breast: Secondary | ICD-10-CM | POA: Insufficient documentation

## 2017-12-17 DIAGNOSIS — Z9102 Food additives allergy status: Secondary | ICD-10-CM | POA: Diagnosis not present

## 2017-12-17 DIAGNOSIS — K219 Gastro-esophageal reflux disease without esophagitis: Secondary | ICD-10-CM | POA: Diagnosis not present

## 2017-12-17 DIAGNOSIS — Z87891 Personal history of nicotine dependence: Secondary | ICD-10-CM | POA: Diagnosis not present

## 2017-12-17 DIAGNOSIS — Z91048 Other nonmedicinal substance allergy status: Secondary | ICD-10-CM | POA: Insufficient documentation

## 2017-12-17 DIAGNOSIS — R2689 Other abnormalities of gait and mobility: Secondary | ICD-10-CM | POA: Insufficient documentation

## 2017-12-17 DIAGNOSIS — Z79899 Other long term (current) drug therapy: Secondary | ICD-10-CM | POA: Insufficient documentation

## 2017-12-17 DIAGNOSIS — Z8249 Family history of ischemic heart disease and other diseases of the circulatory system: Secondary | ICD-10-CM | POA: Diagnosis not present

## 2017-12-17 DIAGNOSIS — Z96651 Presence of right artificial knee joint: Secondary | ICD-10-CM

## 2017-12-17 DIAGNOSIS — M1711 Unilateral primary osteoarthritis, right knee: Principal | ICD-10-CM | POA: Insufficient documentation

## 2017-12-17 DIAGNOSIS — M21161 Varus deformity, not elsewhere classified, right knee: Secondary | ICD-10-CM | POA: Diagnosis not present

## 2017-12-17 DIAGNOSIS — R69 Illness, unspecified: Secondary | ICD-10-CM | POA: Diagnosis not present

## 2017-12-17 HISTORY — PX: TOTAL KNEE ARTHROPLASTY: SHX125

## 2017-12-17 LAB — TYPE AND SCREEN
ABO/RH(D): A POS
Antibody Screen: NEGATIVE

## 2017-12-17 SURGERY — ARTHROPLASTY, KNEE, TOTAL
Anesthesia: Spinal | Site: Knee | Laterality: Right

## 2017-12-17 MED ORDER — ONDANSETRON HCL 4 MG/2ML IJ SOLN
INTRAMUSCULAR | Status: AC
  Filled 2017-12-17: qty 2

## 2017-12-17 MED ORDER — OXYCODONE HCL 5 MG PO TABS
5.0000 mg | ORAL_TABLET | ORAL | Status: DC | PRN
  Administered 2017-12-17 – 2017-12-18 (×2): 5 mg via ORAL
  Filled 2017-12-17 (×2): qty 1

## 2017-12-17 MED ORDER — TRANEXAMIC ACID 1000 MG/10ML IV SOLN
1000.0000 mg | INTRAVENOUS | Status: AC
  Administered 2017-12-17: 1000 mg via INTRAVENOUS
  Filled 2017-12-17: qty 10

## 2017-12-17 MED ORDER — MIDAZOLAM HCL 2 MG/2ML IJ SOLN
INTRAMUSCULAR | Status: AC
  Filled 2017-12-17: qty 2

## 2017-12-17 MED ORDER — TRANEXAMIC ACID 1000 MG/10ML IV SOLN
1000.0000 mg | Freq: Once | INTRAVENOUS | Status: AC
  Administered 2017-12-17: 1000 mg via INTRAVENOUS
  Filled 2017-12-17: qty 1000

## 2017-12-17 MED ORDER — PROMETHAZINE HCL 25 MG/ML IJ SOLN: 6.2500 mg | INTRAMUSCULAR | Status: DC | PRN

## 2017-12-17 MED ORDER — DOCUSATE SODIUM 100 MG PO CAPS
100.0000 mg | ORAL_CAPSULE | Freq: Two times a day (BID) | ORAL | Status: DC
  Administered 2017-12-17 – 2017-12-18 (×2): 100 mg via ORAL
  Filled 2017-12-17 (×2): qty 1

## 2017-12-17 MED ORDER — ALUM & MAG HYDROXIDE-SIMETH 200-200-20 MG/5ML PO SUSP: 30.0000 mL | ORAL | Status: DC | PRN

## 2017-12-17 MED ORDER — WATER FOR IRRIGATION, STERILE IR SOLN
Status: DC | PRN
  Administered 2017-12-17: 2000 mL

## 2017-12-17 MED ORDER — SERTRALINE HCL 100 MG PO TABS
100.0000 mg | ORAL_TABLET | Freq: Every day | ORAL | Status: DC
  Administered 2017-12-18: 100 mg via ORAL
  Filled 2017-12-17 (×2): qty 1

## 2017-12-17 MED ORDER — BISACODYL 5 MG PO TBEC: 5.0000 mg | DELAYED_RELEASE_TABLET | Freq: Every day | ORAL | Status: DC | PRN

## 2017-12-17 MED ORDER — ONDANSETRON HCL 4 MG PO TABS
4.0000 mg | ORAL_TABLET | Freq: Four times a day (QID) | ORAL | Status: DC | PRN
Start: 2017-12-17 — End: 2017-12-18

## 2017-12-17 MED ORDER — FENTANYL CITRATE (PF) 100 MCG/2ML IJ SOLN
INTRAMUSCULAR | Status: DC | PRN
  Administered 2017-12-17: 50 ug via INTRAVENOUS

## 2017-12-17 MED ORDER — HYDROXYZINE HCL 25 MG PO TABS
50.0000 mg | ORAL_TABLET | Freq: Every day | ORAL | Status: DC
  Administered 2017-12-17: 50 mg via ORAL
  Filled 2017-12-17: qty 2

## 2017-12-17 MED ORDER — ACETAMINOPHEN 325 MG PO TABS: 325.0000 mg | ORAL_TABLET | Freq: Four times a day (QID) | ORAL | Status: DC | PRN

## 2017-12-17 MED ORDER — DEXAMETHASONE SODIUM PHOSPHATE 10 MG/ML IJ SOLN
INTRAMUSCULAR | Status: AC
  Filled 2017-12-17: qty 1

## 2017-12-17 MED ORDER — GEMFIBROZIL 600 MG PO TABS
600.0000 mg | ORAL_TABLET | Freq: Every day | ORAL | Status: DC
  Administered 2017-12-17: 600 mg via ORAL
  Filled 2017-12-17 (×2): qty 1

## 2017-12-17 MED ORDER — POLYVINYL ALCOHOL 1.4 % OP SOLN
1.0000 [drp] | Freq: Three times a day (TID) | OPHTHALMIC | Status: DC | PRN
  Filled 2017-12-17: qty 15

## 2017-12-17 MED ORDER — PANTOPRAZOLE SODIUM 40 MG PO TBEC
40.0000 mg | DELAYED_RELEASE_TABLET | Freq: Every day | ORAL | Status: DC
  Administered 2017-12-17 – 2017-12-18 (×2): 40 mg via ORAL
  Filled 2017-12-17 (×2): qty 1

## 2017-12-17 MED ORDER — BUPIVACAINE IN DEXTROSE 0.75-8.25 % IT SOLN
INTRATHECAL | Status: DC | PRN
  Administered 2017-12-17: 1.6 mL via INTRATHECAL

## 2017-12-17 MED ORDER — DIPHENHYDRAMINE HCL 12.5 MG/5ML PO ELIX: 12.5000 mg | ORAL_SOLUTION | ORAL | Status: DC | PRN

## 2017-12-17 MED ORDER — CHLORHEXIDINE GLUCONATE 4 % EX LIQD: 60.0000 mL | Freq: Once | CUTANEOUS | Status: DC

## 2017-12-17 MED ORDER — PROPOFOL 10 MG/ML IV BOLUS
INTRAVENOUS | Status: AC
  Filled 2017-12-17: qty 20

## 2017-12-17 MED ORDER — PHENOL 1.4 % MT LIQD: 1.0000 | OROMUCOSAL | Status: DC | PRN

## 2017-12-17 MED ORDER — SODIUM CHLORIDE 0.9 % IJ SOLN
INTRAMUSCULAR | Status: DC | PRN
  Administered 2017-12-17: 50 mL

## 2017-12-17 MED ORDER — MIDAZOLAM HCL 5 MG/5ML IJ SOLN
INTRAMUSCULAR | Status: DC | PRN
  Administered 2017-12-17: 2 mg via INTRAVENOUS

## 2017-12-17 MED ORDER — METHOCARBAMOL 500 MG IVPB - SIMPLE MED
500.0000 mg | Freq: Four times a day (QID) | INTRAVENOUS | Status: DC | PRN
  Filled 2017-12-17: qty 50

## 2017-12-17 MED ORDER — ASPIRIN 81 MG PO CHEW
81.0000 mg | CHEWABLE_TABLET | Freq: Two times a day (BID) | ORAL | Status: DC
  Administered 2017-12-17 – 2017-12-18 (×2): 81 mg via ORAL
  Filled 2017-12-17 (×2): qty 1

## 2017-12-17 MED ORDER — MEPERIDINE HCL 50 MG/ML IJ SOLN: 6.2500 mg | INTRAMUSCULAR | Status: DC | PRN

## 2017-12-17 MED ORDER — ROPIVACAINE HCL 7.5 MG/ML IJ SOLN
INTRAMUSCULAR | Status: DC | PRN
  Administered 2017-12-17 (×6): 5 mL via PERINEURAL

## 2017-12-17 MED ORDER — METOCLOPRAMIDE HCL 5 MG/ML IJ SOLN: 5.0000 mg | Freq: Three times a day (TID) | INTRAMUSCULAR | Status: DC | PRN

## 2017-12-17 MED ORDER — MENTHOL 3 MG MT LOZG: 1.0000 | LOZENGE | OROMUCOSAL | Status: DC | PRN

## 2017-12-17 MED ORDER — KETOROLAC TROMETHAMINE 30 MG/ML IJ SOLN: 30.0000 mg | Freq: Once | INTRAMUSCULAR | Status: DC | PRN

## 2017-12-17 MED ORDER — TRANEXAMIC ACID 1000 MG/10ML IV SOLN
INTRAVENOUS | Status: DC | PRN
  Administered 2017-12-17: 2000 mg via TOPICAL

## 2017-12-17 MED ORDER — LACTATED RINGERS IV SOLN
INTRAVENOUS | Status: DC
  Administered 2017-12-17: 07:00:00 via INTRAVENOUS

## 2017-12-17 MED ORDER — PROPOFOL 500 MG/50ML IV EMUL
INTRAVENOUS | Status: DC | PRN
  Administered 2017-12-17: 40 ug/kg/min via INTRAVENOUS

## 2017-12-17 MED ORDER — ONDANSETRON HCL 4 MG/2ML IJ SOLN
INTRAMUSCULAR | Status: DC | PRN
  Administered 2017-12-17: 4 mg via INTRAVENOUS

## 2017-12-17 MED ORDER — HYDROMORPHONE HCL 1 MG/ML IJ SOLN: 0.5000 mg | INTRAMUSCULAR | Status: DC | PRN

## 2017-12-17 MED ORDER — METOCLOPRAMIDE HCL 5 MG PO TABS: 5.0000 mg | ORAL_TABLET | Freq: Three times a day (TID) | ORAL | Status: DC | PRN

## 2017-12-17 MED ORDER — POLYETHYLENE GLYCOL 3350 17 G PO PACK: 17.0000 g | PACK | Freq: Every day | ORAL | Status: DC | PRN

## 2017-12-17 MED ORDER — ZOLPIDEM TARTRATE 5 MG PO TABS: 5.0000 mg | ORAL_TABLET | Freq: Every evening | ORAL | Status: DC | PRN

## 2017-12-17 MED ORDER — CELECOXIB 200 MG PO CAPS
200.0000 mg | ORAL_CAPSULE | Freq: Two times a day (BID) | ORAL | Status: DC
  Administered 2017-12-17 – 2017-12-18 (×3): 200 mg via ORAL
  Filled 2017-12-17 (×3): qty 1

## 2017-12-17 MED ORDER — SODIUM CHLORIDE 0.9 % IJ SOLN
INTRAMUSCULAR | Status: AC
  Filled 2017-12-17: qty 50

## 2017-12-17 MED ORDER — PROPOFOL 10 MG/ML IV BOLUS
INTRAVENOUS | Status: AC
  Filled 2017-12-17: qty 40

## 2017-12-17 MED ORDER — FLEET ENEMA 7-19 GM/118ML RE ENEM: 1.0000 | ENEMA | Freq: Once | RECTAL | Status: DC | PRN

## 2017-12-17 MED ORDER — KCL IN DEXTROSE-NACL 20-5-0.45 MEQ/L-%-% IV SOLN
INTRAVENOUS | Status: DC
  Administered 2017-12-17 (×2): via INTRAVENOUS
  Filled 2017-12-17 (×3): qty 1000

## 2017-12-17 MED ORDER — PHENYLEPHRINE 40 MCG/ML (10ML) SYRINGE FOR IV PUSH (FOR BLOOD PRESSURE SUPPORT)
PREFILLED_SYRINGE | INTRAVENOUS | Status: AC
  Filled 2017-12-17: qty 10

## 2017-12-17 MED ORDER — BUPIVACAINE-EPINEPHRINE (PF) 0.5% -1:200000 IJ SOLN
INTRAMUSCULAR | Status: DC | PRN
  Administered 2017-12-17: 20 mL

## 2017-12-17 MED ORDER — SODIUM CHLORIDE 0.9 % IR SOLN
Status: DC | PRN
  Administered 2017-12-17: 1000 mL

## 2017-12-17 MED ORDER — TRAZODONE HCL 50 MG PO TABS
50.0000 mg | ORAL_TABLET | Freq: Every day | ORAL | Status: DC
  Administered 2017-12-17: 50 mg via ORAL
  Filled 2017-12-17: qty 1

## 2017-12-17 MED ORDER — GABAPENTIN 300 MG PO CAPS
300.0000 mg | ORAL_CAPSULE | Freq: Three times a day (TID) | ORAL | Status: DC
  Administered 2017-12-17 – 2017-12-18 (×4): 300 mg via ORAL
  Filled 2017-12-17 (×4): qty 1

## 2017-12-17 MED ORDER — DEXAMETHASONE SODIUM PHOSPHATE 10 MG/ML IJ SOLN
INTRAMUSCULAR | Status: DC | PRN
  Administered 2017-12-17: 8 mg via INTRAVENOUS

## 2017-12-17 MED ORDER — CEFAZOLIN SODIUM-DEXTROSE 2-4 GM/100ML-% IV SOLN
2.0000 g | INTRAVENOUS | Status: AC
  Administered 2017-12-17: 2 g via INTRAVENOUS
  Filled 2017-12-17: qty 100

## 2017-12-17 MED ORDER — ONDANSETRON HCL 4 MG/2ML IJ SOLN: 4.0000 mg | Freq: Four times a day (QID) | INTRAMUSCULAR | Status: DC | PRN

## 2017-12-17 MED ORDER — BUPIVACAINE LIPOSOME 1.3 % IJ SUSP
INTRAMUSCULAR | Status: DC | PRN
  Administered 2017-12-17: 20 mL

## 2017-12-17 MED ORDER — BUPIVACAINE-EPINEPHRINE (PF) 0.5% -1:200000 IJ SOLN
INTRAMUSCULAR | Status: AC
  Filled 2017-12-17: qty 30

## 2017-12-17 MED ORDER — PHENYLEPHRINE 40 MCG/ML (10ML) SYRINGE FOR IV PUSH (FOR BLOOD PRESSURE SUPPORT)
PREFILLED_SYRINGE | INTRAVENOUS | Status: DC | PRN
  Administered 2017-12-17 (×2): 40 ug via INTRAVENOUS

## 2017-12-17 MED ORDER — FENTANYL CITRATE (PF) 100 MCG/2ML IJ SOLN
INTRAMUSCULAR | Status: AC
  Filled 2017-12-17: qty 2

## 2017-12-17 MED ORDER — METHOCARBAMOL 500 MG PO TABS
500.0000 mg | ORAL_TABLET | Freq: Four times a day (QID) | ORAL | Status: DC | PRN
Start: 2017-12-17 — End: 2017-12-18
  Administered 2017-12-17: 500 mg via ORAL
  Filled 2017-12-17: qty 1

## 2017-12-17 MED ORDER — HYDROMORPHONE HCL 1 MG/ML IJ SOLN: 0.2500 mg | INTRAMUSCULAR | Status: DC | PRN

## 2017-12-17 SURGICAL SUPPLY — 51 items
ATTUNE PS FEM RT SZ 5 CEM KNEE (Femur) ×2 IMPLANT
ATTUNE PSRP INSR SZ5 5 KNEE (Insert) ×2 IMPLANT
BAG DECANTER FOR FLEXI CONT (MISCELLANEOUS) ×2 IMPLANT
BAG ZIPLOCK 12X15 (MISCELLANEOUS) ×2 IMPLANT
BANDAGE ACE 6X5 VEL STRL LF (GAUZE/BANDAGES/DRESSINGS) ×2 IMPLANT
BASE TIBIAL ROT PLAT SZ 5 KNEE (Knees) ×1 IMPLANT
BLADE SAW SGTL 13.0X1.19X90.0M (BLADE) ×2 IMPLANT
BLADE SAW SGTL 13X75X1.27 (BLADE) ×2 IMPLANT
BNDG ELASTIC 6X10 VLCR STRL LF (GAUZE/BANDAGES/DRESSINGS) ×2 IMPLANT
BOWL SMART MIX CTS (DISPOSABLE) ×2 IMPLANT
CEMENT HV SMART SET (Cement) ×4 IMPLANT
COVER SURGICAL LIGHT HANDLE (MISCELLANEOUS) ×2 IMPLANT
CUFF TOURN SGL QUICK 34 (TOURNIQUET CUFF) ×1
CUFF TRNQT CYL 34X4X40X1 (TOURNIQUET CUFF) ×1 IMPLANT
DECANTER SPIKE VIAL GLASS SM (MISCELLANEOUS) ×6 IMPLANT
DRAPE ORTHO SPLIT 77X108 STRL (DRAPES) ×1
DRAPE SURG ORHT 6 SPLT 77X108 (DRAPES) ×1 IMPLANT
DRAPE U-SHAPE 47X51 STRL (DRAPES) ×2 IMPLANT
DRSG AQUACEL AG ADV 3.5X10 (GAUZE/BANDAGES/DRESSINGS) ×2 IMPLANT
DURAPREP 26ML APPLICATOR (WOUND CARE) ×2 IMPLANT
ELECT REM PT RETURN 15FT ADLT (MISCELLANEOUS) ×2 IMPLANT
GLOVE BIO SURGEON STRL SZ7.5 (GLOVE) IMPLANT
GLOVE BIO SURGEON STRL SZ8.5 (GLOVE) IMPLANT
GLOVE BIOGEL PI IND STRL 7.0 (GLOVE) ×4 IMPLANT
GLOVE BIOGEL PI IND STRL 8 (GLOVE) ×1 IMPLANT
GLOVE BIOGEL PI IND STRL 9 (GLOVE) ×1 IMPLANT
GLOVE BIOGEL PI INDICATOR 7.0 (GLOVE) ×4
GLOVE BIOGEL PI INDICATOR 8 (GLOVE) ×1
GLOVE BIOGEL PI INDICATOR 9 (GLOVE) ×1
GLOVE INDICATOR 7.5 STRL GRN (GLOVE) ×8 IMPLANT
GOWN STRL REUS W/TWL XL LVL3 (GOWN DISPOSABLE) ×8 IMPLANT
HANDPIECE INTERPULSE COAX TIP (DISPOSABLE) ×1
HOLDER FOLEY CATH W/STRAP (MISCELLANEOUS) IMPLANT
HOOD PEEL AWAY FLYTE STAYCOOL (MISCELLANEOUS) ×6 IMPLANT
NEEDLE HYPO 22GX1.5 SAFETY (NEEDLE) ×2 IMPLANT
PACK ICE MAXI GEL EZY WRAP (MISCELLANEOUS) ×2 IMPLANT
PACK TOTAL KNEE CUSTOM (KITS) ×2 IMPLANT
PATELLA MEDIAL ATTUN 35MM KNEE (Knees) ×2 IMPLANT
POSITIONER SURGICAL ARM (MISCELLANEOUS) ×2 IMPLANT
SET HNDPC FAN SPRY TIP SCT (DISPOSABLE) ×1 IMPLANT
STAPLER VISISTAT 35W (STAPLE) IMPLANT
SUT VIC AB 1 CTX 36 (SUTURE) ×1
SUT VIC AB 1 CTX36XBRD ANBCTR (SUTURE) ×1 IMPLANT
SUT VIC AB 2-0 CT1 27 (SUTURE) ×1
SUT VIC AB 2-0 CT1 TAPERPNT 27 (SUTURE) ×1 IMPLANT
SUT VIC AB 3-0 CT1 27 (SUTURE) ×1
SUT VIC AB 3-0 CT1 TAPERPNT 27 (SUTURE) ×1 IMPLANT
SYR CONTROL 10ML LL (SYRINGE) ×4 IMPLANT
TIBIAL BASE ROT PLAT SZ 5 KNEE (Knees) ×2 IMPLANT
WRAP KNEE MAXI GEL POST OP (GAUZE/BANDAGES/DRESSINGS) ×2 IMPLANT
YANKAUER SUCT BULB TIP 10FT TU (MISCELLANEOUS) ×2 IMPLANT

## 2017-12-17 NOTE — Interval H&P Note (Signed)
History and Physical Interval Note:  12/17/2017 6:48 AM  Paula Poole  has presented today for surgery, with the diagnosis of RIGHT KNEE OSTEOARTHRITIS  The various methods of treatment have been discussed with the patient and family. After consideration of risks, benefits and other options for treatment, the patient has consented to  Procedure(s) with comments: RIGHT TOTAL KNEE ARTHROPLASTY (Right) - Needs RNFA as a surgical intervention .  The patient's history has been reviewed, patient examined, no change in status, stable for surgery.  I have reviewed the patient's chart and labs.  Questions were answered to the patient's satisfaction.     Nestor LewandowskyFrank J Kirill Chatterjee

## 2017-12-17 NOTE — Progress Notes (Signed)
CSW consult- SNF Plan: Home with Family  CSW will sign off.   Vivi BarrackNicole Ladarien Beeks, Alexander MtLCSW, MSW Clinical Social Worker  [phone] 12/17/2017  10:55 AM

## 2017-12-17 NOTE — Anesthesia Postprocedure Evaluation (Signed)
Anesthesia Post Note  Patient: Rose FillersKaren Buonocore  Procedure(s) Performed: RIGHT TOTAL KNEE ARTHROPLASTY (Right Knee)     Patient location during evaluation: PACU Anesthesia Type: Spinal Level of consciousness: awake Pain management: pain level controlled Vital Signs Assessment: post-procedure vital signs reviewed and stable Respiratory status: spontaneous breathing Cardiovascular status: stable Postop Assessment: no headache, no backache, spinal receding, patient able to bend at knees and no apparent nausea or vomiting Anesthetic complications: no    Last Vitals:  Vitals:   12/17/17 1000 12/17/17 1022  BP: (!) 109/51 (!) 115/50  Pulse: 70 75  Resp: 14 14  Temp: 37.1 C 36.7 C  SpO2: 99% 97%    Last Pain:  Vitals:   12/17/17 1000  TempSrc:   PainSc: Asleep   Pain Goal: Patients Stated Pain Goal: 4 (12/17/17 0612)               Aamani Moose JR,JOHN Susann GivensFRANKLIN

## 2017-12-17 NOTE — Anesthesia Procedure Notes (Signed)
   Anesthesia Regional Block: Adductor canal block   Pre-Anesthetic Checklist: ,, timeout performed, Correct Patient, Correct Site, Correct Laterality, Correct Procedure, Correct Position, site marked, Risks and benefits discussed,  Surgical consent,  Pre-op evaluation,  At surgeon's request and post-op pain management  Laterality: Right and Lower  Prep: chloraprep       Needles:  Injection technique: Single-shot  Needle Type: Echogenic Stimulator Needle     Needle Length: 10cm  Needle Gauge: 21   Needle insertion depth: 3 cm   Additional Needles:   Procedures:,,,, ultrasound used (permanent image in chart),,,,  Narrative:  Start time: 12/17/2017 7:00 AM End time: 12/17/2017 7:10 AM Injection made incrementally with aspirations every 5 mL.  Events: blood aspirated,,,,,,,,,,  Performed by: Personally  Anesthesiologist: Leilani AbleHatchett, Flower Franko, MD

## 2017-12-17 NOTE — Anesthesia Procedure Notes (Signed)
Spinal  Patient location during procedure: OR Start time: 12/17/2017 7:17 AM End time: 12/17/2017 7:21 AM Staffing Anesthesiologist: Leilani AbleHatchett, Horris Speros, MD Performed: anesthesiologist  Preanesthetic Checklist Completed: patient identified, site marked, surgical consent, pre-op evaluation, timeout performed, IV checked, risks and benefits discussed and monitors and equipment checked Spinal Block Patient position: sitting Prep: site prepped and draped and DuraPrep Patient monitoring: continuous pulse ox and blood pressure Approach: midline Location: L3-4 Injection technique: single-shot Needle Needle type: Pencan  Needle gauge: 24 G Needle length: 10 cm Needle insertion depth: 5 cm Assessment Sensory level: T8

## 2017-12-17 NOTE — Transfer of Care (Signed)
Immediate Anesthesia Transfer of Care Note  Patient: Paula Poole  Procedure(s) Performed: RIGHT TOTAL KNEE ARTHROPLASTY (Right Knee)  Patient Location: PACU  Anesthesia Type:MAC and Spinal  Level of Consciousness: awake, alert , oriented and patient cooperative  Airway & Oxygen Therapy: Patient Spontanous Breathing and Patient connected to face mask oxygen  Post-op Assessment: Report given to RN and Post -op Vital signs reviewed and stable  Post vital signs: Reviewed and stable  Last Vitals:  Vitals Value Taken Time  BP 102/52 12/17/2017  9:15 AM  Temp    Pulse 78 12/17/2017  9:17 AM  Resp 22 12/17/2017  9:17 AM  SpO2 100 % 12/17/2017  9:17 AM  Vitals shown include unvalidated device data.  Last Pain:  Vitals:   12/17/17 0612  TempSrc:   PainSc: 0-No pain      Patients Stated Pain Goal: 4 (22/02/54 2706)  Complications: No apparent anesthesia complications

## 2017-12-17 NOTE — Op Note (Addendum)
PATIENT ID:      Paula FillersKaren Gilliam  MRN:     [id number] DOB/AGE:    1950-08-21 / 67 y.o.       OPERATIVE REPORT    DATE OF PROCEDURE:  12/17/2017       PREOPERATIVE DIAGNOSIS:   RIGHT KNEE OSTEOARTHRITIS      Estimated body mass index is 28.86 kg/m as calculated from the following:   Height as of this encounter: 5' 5.5" (1.664 m).   Weight as of this encounter: 79.9 kg.                                                        POSTOPERATIVE DIAGNOSIS:   RIGHT KNEE OSTEOARTHRITIS                                                                      PROCEDURE:  Procedure(s): RIGHT TOTAL KNEE ARTHROPLASTY Using DepuyAttune RP implants #5R Femur, #5Tibia, 5 mm Attune RP bearing, 35 Patella     SURGEON: Nestor LewandowskyFrank J Payam Gribble    ASSISTANT:   Gaynelle CageKierstin Schrader OPA  (Present and scrubbed throughout the case, critical for assistance with exposure, retraction, instrumentation, and closure.)         ANESTHESIA: Spinal, 20cc Exparel, 50cc 0.25% Marcaine  EBL: 300cc  FLUID REPLACEMENT: 1600cc crystalloid  TOURNIQUET TIME: 15min  Drains: None  Tranexamic Acid: 1gm IV, 2gm topical  COMPLICATIONS:  None         INDICATIONS FOR PROCEDURE: The patient has  RIGHT KNEE OSTEOARTHRITIS, Var deformities, XR shows bone on bone arthritis, lateral subluxation of tibia. Patient has failed all conservative measures including anti-inflammatory medicines, narcotics, attempts at  exercise and weight loss, cortisone injections and viscosupplementation.  Risks and benefits of surgery have been discussed, questions answered.   DESCRIPTION OF PROCEDURE: The patient identified by armband, received  IV antibiotics, in [facility] holding [facility]. Patient taken to the operating room, appropriate anesthetic  monitors were attached, and Spinal anesthesia was  induced. Tourniquet  applied high to the operative thigh. Lateral post and foot positioner  applied to the table, the lower extremity was then prepped and draped  in  usual sterile fashion from the toes to the tourniquet. Time-out procedure was performed. We began the operation, with the knee flexed 120 degrees, by making the anterior midline incision starting at handbreadth above the patella going over the patella 1 cm medial to and 4 cm distal to the tibial tubercle. Small bleeders in the skin and the  subcutaneous tissue identified and cauterized. Transverse retinaculum was incised and reflected medially and a medial parapatellar arthrotomy was accomplished. the patella was everted and theprepatellar fat pad resected. The superficial medial collateral  ligament was then elevated from anterior to posterior along the proximal  flare of the tibia and anterior half of the menisci resected. The knee was hyperflexed exposing bone on bone arthritis. Peripheral and notch osteophytes as well as the cruciate ligaments were then resected. We continued to  work our way around posteriorly along the proximal tibia, and externally  rotated the tibia subluxing it out from underneath the femur. A McHale  retractor was placed through the notch and a lateral Hohmann retractor  placed, and we then drilled through the proximal tibia in line with the  axis of the tibia followed by an intramedullary guide rod and 2-degree  posterior slope cutting guide. The tibial cutting guide, 3 degree posterior sloped, was pinned into place allowing resection of 4 mm of bone medially and 9 mm of bone laterally. Satisfied with the tibial resection, we then  entered the distal femur 2 mm anterior to the PCL origin with the  intramedullary guide rod and applied the distal femoral cutting guide  set at 9 mm, with 5 degrees of valgus. This was pinned along the  epicondylar axis. At this point, the distal femoral cut was accomplished without difficulty. We then sized for a #5R femoral component and pinned the guide in 3 degrees of external rotation. The chamfer cutting guide was pinned into place. The  anterior, posterior, and chamfer cuts were accomplished without difficulty followed by  the Attune RP box cutting guide and the box cut. We also removed posterior osteophytes from the posterior femoral condyles. At this  time, the knee was brought into full extension. We checked our  extension and flexion gaps and found them symmetric for a 5 mm bearing. Distracting in extension with a lamina spreader, the posterior horns of the menisci were removed, and Exparel, diluted to 60 cc, with 20cc NS, and 20cc 0.5% Marcaine,was injected into the capsule and synovium of the knee. The posterior patella cut was accomplished with the 9.5 mm Attune cutting guide, sized for a 35mm dome, and the fixation pegs drilled.The knee  was then once again hyperflexed exposing the proximal tibia. We sized for a # 5 tibial base plate, applied the smokestack and the conical reamer followed by the the Delta fin keel punch. We then hammered into place the Attune RP trial femoral component, drilled the lugs, inserted a  5 mm trial bearing, trial patellar button, and took the knee through range of motion from 0-130 degrees. No thumb pressure was required for patellar Tracking. At this point, the limb was wrapped with an Esmarch bandage and the tourniquet inflated to 350 mmHg. All trial components were removed, mating surfaces irrigated with pulse lavage, and dried with suction and sponges. 10 cc of the Exparel solution was applied to the cancellus bone of the patella distal femur and proximal tibia.  After waiting 1 minute, the bony surfaces were again, dried with sponges. A double batch of DePuy HV cement with 1500 mg of Zinacef was mixed and applied to all bony metallic mating surfaces except for the posterior condyles of the femur itself. In order, we hammered into place the tibial tray and removed excess cement, the femoral component and removed excess cement. The final Attune RP bearing  was inserted, and the knee brought to full  extension with compression.  The patellar button was clamped into place, and excess cement  removed. While the cement cured the wound was irrigated out with normal saline solution pulse lavage. Ligament stability and patellar tracking were checked and found to be excellent. The parapatellar arthrotomy was closed with  running #1 Vicryl suture. The subcutaneous tissue with 0 and 2-0 undyed  Vicryl suture, and the skin with running 3-0 SQ vicryl. A dressing of Xeroform,  4 x 4, dressing sponges, Webril, and Ace wrap applied. The patient  awakened, and taken to recovery room  without difficulty.   Nestor Lewandowsky 12/17/2017, 9:04 AM

## 2017-12-17 NOTE — Care Plan (Signed)
Spoke with patient prior to surgery. She will discharge to home with the her family to assist and the following arrangements:   DME: RW ordered will be delivered to room prior to discharge.  HHPT: [facility]  [facility]: [facility] 01/01/18 @ 1040  MD follow up: Dr Turner Danielsowan 01/01/18 @ 930   Please contact Renee Angiulli, RNCM with questions or if this plan should need to change. [phone]  Thanks

## 2017-12-17 NOTE — Progress Notes (Signed)
Care Plan Notes 09/18/2017 to 12/17/2017       Care Plan by Shauna HughAngiulli, Renee at 12/17/2017  8:55 AM    Date of Service   Author Author Type Status Note Type File Time  12/17/2017  Shauna Hughngiulli, Renee  Signed Care Plan 12/17/2017             Spoke with patient prior to surgery. She will discharge to home with the her family to assist and the following arrangements:   DME: RW ordered will be delivered to room prior to discharge.  HHPT: [facility]  [facility]: [facility] [address] 01/01/18 @ 1040  MD follow up: Dr Turner Danielsowan 01/01/18 @ 930   Please contact Renee Angiulli, RNCM with questions or if this plan should need to change. [phone]  Thanks

## 2017-12-17 NOTE — Evaluation (Signed)
Physical Therapy Evaluation Patient Details Name: Paula FillersKaren Stjames MRN: [id number] DOB: 1950-06-19 Today's Date: 12/17/2017   History of Present Illness  67 yo female s/p R TKA 12/17/17. Hx of R THA-DA 2018  Clinical Impression  On eval POD 0, pt was supervision level for mobility. She walked ~70 feet with RW. Mild pain with activity. Will follow and progress activity as tolerated. Per chart, plan is for pt to d/c home with HHPT f/u.     Follow Up Recommendations Follow surgeon's recommendation for DC plan and follow-up therapies    Equipment Recommendations  Rolling walker with 5" wheels    Recommendations for Other Services       Precautions / Restrictions Precautions Precautions: Fall Restrictions Weight Bearing Restrictions: No Other Position/Activity Restrictions: WBAT      Mobility  Bed Mobility Overal bed mobility: Needs Assistance Bed Mobility: Supine to Sit     Supine to sit: Supervision;HOB elevated     General bed mobility comments: for safety, lines.   Transfers Overall transfer level: Needs assistance Equipment used: Rolling walker (2 wheeled) Transfers: Sit to/from Stand Sit to Stand: Supervision         General transfer comment: VCS safety, hand/LE placement.   Ambulation/Gait Ambulation/Gait assistance: Supervision Gait Distance (Feet): 70 Feet Assistive device: Rolling walker (2 wheeled) Gait Pattern/deviations: Step-to pattern;Step-through pattern;Decreased stride length     General Gait Details: for safety. VCs safety, sequence. Pt tolerated distance well. Transitioned to reciprocal gait pattern towards end of distance  Stairs            Wheelchair Mobility    Modified Rankin (Stroke Patients Only)       Balance Overall balance assessment: Mild deficits observed, not formally tested                                           Pertinent Vitals/Pain Pain Assessment: 0-10 Pain Score: 3  Pain Location: R  knee Pain Descriptors / Indicators: Aching;Sore Pain Intervention(s): Monitored during session;Repositioned;Ice applied    Home Living Family/patient expects to be discharged to:: Private residence Living Arrangements: Alone Available Help at Discharge: Family Type of Home: Apartment Home Access: Level entry     Home Layout: One level Home Equipment: Production assistant, radiohower seat;Cane - single point      Prior Function Level of Independence: Independent               Hand Dominance        Extremity/Trunk Assessment   Upper Extremity Assessment Upper Extremity Assessment: Overall WFL for tasks assessed    Lower Extremity Assessment Lower Extremity Assessment: Generalized weakness(s/p R TKA)    Cervical / Trunk Assessment Cervical / Trunk Assessment: Normal  Communication   Communication: No difficulties  Cognition Arousal/Alertness: Awake/alert Behavior During Therapy: WFL for tasks assessed/performed Overall Cognitive Status: Within Functional Limits for tasks assessed                                        General Comments      Exercises     Assessment/Plan    PT Assessment Patient needs continued PT services  PT Problem List Decreased strength;Decreased range of motion;Decreased mobility;Pain;Decreased knowledge of use of DME;Decreased activity tolerance       PT Treatment Interventions DME instruction;Gait training;Functional mobility  training;Therapeutic activities;Balance training;Patient/family education;Therapeutic exercise    PT Goals (Current goals can be found in the Care Plan section)  Acute Rehab PT Goals Patient Stated Goal: regain PLOF PT Goal Formulation: With patient Time For Goal Achievement: 12/31/17 Potential to Achieve Goals: Good    Frequency 7X/week   Barriers to discharge        Co-evaluation               AM-PAC PT "6 Clicks" Daily Activity  Outcome Measure Difficulty turning over in bed (including adjusting  bedclothes, sheets and blankets)?: A Little Difficulty moving from lying on back to sitting on the side of the bed? : A Little Difficulty sitting down on and standing up from a chair with arms (e.g., wheelchair, bedside commode, etc,.)?: A Little Help needed moving to and from a bed to chair (including a wheelchair)?: A Little Help needed walking in hospital room?: A Little Help needed climbing 3-5 steps with a railing? : A Little 6 Click Score: 18    End of Session Equipment Utilized During Treatment: Gait belt Activity Tolerance: Patient tolerated treatment well Patient left: in chair;with call bell/phone within reach   PT Visit Diagnosis: Other abnormalities of gait and mobility (R26.89);Pain Pain - Right/Left: Right Pain - part of body: Knee    Time: [id number] PT Time Calculation (min) (ACUTE ONLY): 23 min   Charges:   PT Evaluation $PT Eval Low Complexity: 1 Low PT Treatments $Gait Training: 8-22 mins         Rebeca AlertJannie Lisbeth Puller, MPT Pager: [phone]

## 2017-12-18 ENCOUNTER — Encounter (HOSPITAL_COMMUNITY): Payer: Self-pay | Admitting: Orthopedic Surgery

## 2017-12-18 DIAGNOSIS — M1711 Unilateral primary osteoarthritis, right knee: Secondary | ICD-10-CM | POA: Diagnosis not present

## 2017-12-18 LAB — BASIC METABOLIC PANEL
Anion gap: 6 (ref 5–15)
BUN: 14 mg/dL (ref 8–23)
CO2: 24 mmol/L (ref 22–32)
Calcium: 8.9 mg/dL (ref 8.9–10.3)
Chloride: 111 mmol/L (ref 98–111)
Creatinine, Ser: 0.56 mg/dL (ref 0.44–1.00)
GFR calc Af Amer: >60 mL/min (ref >60)
GFR calc non Af Amer: >60 mL/min (ref >60)
Glucose, Bld: 169 mg/dL — ABNORMAL HIGH (ref 70–99)
Potassium: 4.8 mmol/L (ref 3.5–5.1)
Sodium: 141 mmol/L (ref 135–145)

## 2017-12-18 LAB — CBC
HCT: 32.6 % — ABNORMAL LOW (ref 36.0–46.0)
Hemoglobin: 10.6 g/dL — ABNORMAL LOW (ref 12.0–15.0)
MCH: 28.9 pg (ref 26.0–34.0)
MCHC: 32.5 g/dL (ref 30.0–36.0)
MCV: 88.8 fL (ref 78.0–100.0)
Platelets: 130 10*3/uL — ABNORMAL LOW (ref 150–400)
RBC: 3.67 MIL/uL — ABNORMAL LOW (ref 3.87–5.11)
RDW: 12.7 % (ref 11.5–15.5)
WBC: 14.3 10*3/uL — ABNORMAL HIGH (ref 4.0–10.5)

## 2017-12-18 MED ORDER — TIZANIDINE HCL 2 MG PO CAPS: 4.0000 mg | ORAL_CAPSULE | Freq: Three times a day (TID) | ORAL | 0 refills | Status: DC | PRN

## 2017-12-18 MED ORDER — ASPIRIN EC 81 MG PO TBEC: 81.0000 mg | DELAYED_RELEASE_TABLET | Freq: Two times a day (BID) | ORAL | 2 refills | Status: AC

## 2017-12-18 MED ORDER — OXYCODONE HCL 5 MG PO TABS: 5.0000 mg | ORAL_TABLET | ORAL | 0 refills | Status: AC | PRN

## 2017-12-18 NOTE — Discharge Instructions (Signed)

## 2017-12-18 NOTE — Progress Notes (Addendum)
Physical Therapy Treatment Patient Details Name: Paula FillersKaren Poole MRN: [id number] DOB: 04/25/1951 Today's Date: 12/18/2017    History of Present Illness 67 yo female s/p R TKA 12/17/17. Hx of R THA-DA 2018    PT Comments    Progressing well with mobility. Reviewed exercises and gait training. Pt stated she does not have any steps to enter home. Pt did not want a 2nd session. All education completed. Okay to d/c from PT standpoint-made RN aware.    Follow Up Recommendations  Follow surgeon's recommendation for DC plan and follow-up therapies     Equipment Recommendations  Rolling walker with 5" wheels    Recommendations for Other Services       Precautions / Restrictions Precautions Precautions: Fall Restrictions Weight Bearing Restrictions: No Other Position/Activity Restrictions: WBAT    Mobility  Bed Mobility Overal bed mobility: Needs Assistance Bed Mobility: Supine to Sit;Sit to Supine     Supine to sit: Supervision;HOB elevated     General bed mobility comments: for safety, lines.   Transfers Overall transfer level: Needs assistance Equipment used: Rolling walker (2 wheeled) Transfers: Sit to/from Stand Sit to Stand: Supervision         General transfer comment: VCS safety, hand/LE placement.   Ambulation/Gait Ambulation/Gait assistance: Supervision Gait Distance (Feet): 115 Feet Assistive device: Rolling walker (2 wheeled) Gait Pattern/deviations: Step-through pattern;Decreased stride length     General Gait Details: for safety. VCs safety. Pt tolerated distance well.    Stairs             Wheelchair Mobility    Modified Rankin (Stroke Patients Only)       Balance                                            Cognition Arousal/Alertness: Awake/alert Behavior During Therapy: WFL for tasks assessed/performed Overall Cognitive Status: Within Functional Limits for tasks assessed                                         Exercises Total Joint Exercises Ankle Circles/Pumps: AROM;Both;10 reps;Supine Quad Sets: AROM;Both;10 reps;Supine Heel Slides: AAROM;Right;10 reps;Supine Hip ABduction/ADduction: AROM;Right;10 reps;Supine Straight Leg Raises: AROM;Right;10 reps;Supine Goniometric ROM: 0-70 degrees    General Comments        Pertinent Vitals/Pain Pain Assessment: 0-10 Pain Score: 6  Pain Location: R knee Pain Descriptors / Indicators: Aching;Sore Pain Intervention(s): Monitored during session;Repositioned;Ice applied    Home Living                      Prior Function            PT Goals (current goals can now be found in the care plan section) Progress towards PT goals: Progressing toward goals    Frequency    7X/week      PT Plan Current plan remains appropriate    Co-evaluation              AM-PAC PT "6 Clicks" Daily Activity  Outcome Measure  Difficulty turning over in bed (including adjusting bedclothes, sheets and blankets)?: None Difficulty moving from lying on back to sitting on the side of the bed? : None Difficulty sitting down on and standing up from a chair with arms (e.g., wheelchair, bedside commode, etc,.)?:  A Little Help needed moving to and from a bed to chair (including a wheelchair)?: A Little Help needed walking in hospital room?: A Little Help needed climbing 3-5 steps with a railing? : A Little 6 Click Score: 20    End of Session Equipment Utilized During Treatment: Gait belt Activity Tolerance: Patient tolerated treatment well Patient left: in chair;with family/visitor present   PT Visit Diagnosis: Other abnormalities of gait and mobility (R26.89);Pain Pain - Right/Left: Right Pain - part of body: Knee     Time: 1000-1020 PT Time Calculation (min) (ACUTE ONLY): 20 min  Charges:  $Gait Training: 8-22 mins                        Rebeca AlertJannie Daisy Poole, MPT Pager: [phone]

## 2017-12-18 NOTE — Discharge Summary (Signed)
Patient ID: Paula Poole MRN: [id number] DOB/AGE: 06/26/1950 67 y.o.  Admit date: 12/17/2017 Discharge date: 12/18/2017  Admission Diagnoses:  Principal Problem:   Osteoarthritis of right knee Active Problems:   Total knee replacement status, right   Discharge Diagnoses:  Same  Past Medical History:  Diagnosis Date  . Anemia    as a child  . Arthritis   . Depression    due to death of husband a year ago  . GERD (gastroesophageal reflux disease)    not for years  . High cholesterol   . Pneumonia     Surgeries: Procedure(s): RIGHT TOTAL KNEE ARTHROPLASTY on 12/17/2017   Consultants:   Discharged Condition: Improved  Hospital Course: Paula Poole is an 67 y.o. female who was admitted 12/17/2017 for operative treatment ofOsteoarthritis of right knee. Patient has severe unremitting pain that affects sleep, daily activities, and work/hobbies. After pre-op clearance the patient was taken to the operating room on 12/17/2017 and underwent  Procedure(s): RIGHT TOTAL KNEE ARTHROPLASTY.    Patient was given perioperative antibiotics:  Anti-infectives (From admission, onward)   Start     Dose/Rate Route Frequency Ordered Stop   12/17/17 0600  ceFAZolin (ANCEF) IVPB 2g/100 mL premix     2 g 200 mL/hr over 30 Minutes Intravenous On call to O.R. 12/17/17 [id number] 12/17/17 0758       Patient was given sequential compression devices, early ambulation, and chemoprophylaxis to prevent DVT.  Patient benefited maximally from hospital stay and there were no complications.    Recent vital signs:  Patient Vitals for the past 24 hrs:  BP Temp Temp src Pulse Resp SpO2  12/18/17 0522 (Abnormal) 114/54 98.1 F (36.7 C) Oral 70 16 95 %  12/18/17 0120 (Abnormal) 104/59 97.7 F (36.5 C) Oral 68 16 95 %  12/17/17 2106 115/77 99.3 F (37.4 C) Oral 87 16 95 %  12/17/17 1825 (Abnormal) 85/63 98 F (36.7 C) Oral 90 no documentation 97 %  12/17/17 1329 (Abnormal) 115/57 98.2 F (36.8 C) Oral 84 16  97 %  12/17/17 1223 122/62 98.5 F (36.9 C) Oral 81 17 98 %  12/17/17 1123 (Abnormal) 118/57 97.6 F (36.4 C) Oral 81 15 100 %  12/17/17 1022 (Abnormal) 115/50 98 F (36.7 C) no documentation 75 14 97 %  12/17/17 1000 (Abnormal) 109/51 98.8 F (37.1 C) no documentation 70 14 99 %  12/17/17 0945 111/60 no documentation no documentation 65 15 100 %  12/17/17 0930 (Abnormal) 112/54 no documentation no documentation 68 15 100 %  12/17/17 0915 (Abnormal) 102/52 98.3 F (36.8 C) no documentation 81 13 100 %     Recent laboratory studies:  Recent Labs    12/18/17 0516  WBC 14.3*  HGB 10.6*  HCT 32.6*  PLT 130*  NA 141  K 4.8  CL 111  CO2 24  BUN 14  CREATININE 0.56  GLUCOSE 169*  CALCIUM 8.9     Discharge Medications:   Allergies as of 12/18/2017    Allergen Reactions Comment   Nickel Hives Cheap jewelry makes her itchy   Blackberry Flavor Hives    Latex Itching       Medication List    Take these medications   AIRBORNE Chew Chew 1 tablet by mouth daily.   amoxicillin 500 MG capsule Commonly known as:  AMOXIL Take 2,000 mg by mouth See admin instructions. Take 4 capsules (2000 mg) by mouth 1 hour prior to dental appointment.   aspirin EC 81 MG tablet  Take 1 tablet (81 mg total) by mouth 2 (two) times daily.   gemfibrozil 600 MG tablet Commonly known as:  LOPID Take 600 mg by mouth at bedtime.   hydrOXYzine 25 MG capsule Commonly known as:  VISTARIL Take 50 mg by mouth at bedtime.   meloxicam 15 MG tablet Commonly known as:  MOBIC Take 15 mg by mouth daily.   multivitamin with minerals Tabs tablet Take 1 tablet by mouth daily. Multivitamin for Women   naproxen sodium 220 MG tablet Commonly known as:  ALEVE Take 220 mg by mouth 2 (two) times daily as needed (for pain.).   OVER THE COUNTER MEDICATION Place 1 drop into both eyes daily as needed (for irritated ears). MIRACELL PROEAR   oxyCODONE 5 MG immediate release tablet Commonly known as:  Oxy  IR/ROXICODONE Take 1-2 tablets (5-10 mg total) by mouth every 4 (four) hours as needed for up to 7 days for severe pain.   sertraline 100 MG tablet Commonly known as:  ZOLOFT Take 100 mg by mouth daily.   SYSTANE 0.4-0.3 % Soln Generic drug:  Polyethyl Glycol-Propyl Glycol Place 1 drop into both eyes 3 (three) times daily as needed (for dry eyes.).   tizanidine 2 MG capsule Commonly known as:  ZANAFLEX Take 2 capsules (4 mg total) by mouth 3 (three) times daily as needed for muscle spasms.   traMADol 50 MG tablet Commonly known as:  ULTRAM Take 50 mg by mouth 2 (two) times daily.   traZODone 50 MG tablet Commonly known as:  DESYREL Take 50 mg by mouth at bedtime.        Durable Medical Equipment  (From admission, onward)         Start     Ordered   12/17/17 1034  DME Walker rolling  Once    Question:  Patient needs a walker to treat with the following condition  Answer:  Status post right knee replacement   12/17/17 1033   12/17/17 1034  DME 3 n 1  Once     12/17/17 1033           Discharge Care Instructions  (From admission, onward)         Start     Ordered   12/18/17 0000  Change dressing    Comments:  Change dressing if > 40% drainage in window   12/18/17 0653          Diagnostic Studies: Dg Chest 2 View  Result Date: 12/10/2017 CLINICAL DATA:  Preoperative examination prior right knee replacement. Former smoker, discontinued 1 year ago. EXAM: CHEST - 2 VIEW COMPARISON:  Chest x-ray of October 26, 2016. FINDINGS: The lungs are adequately inflated and clear. The heart and pulmonary vascularity are normal. The mediastinum is normal in width. The trachea is midline. There is calcification in the wall of the aortic arch. There is no pleural effusion. The bony thorax exhibits no acute abnormality. IMPRESSION: There is no active cardiopulmonary disease. Thoracic aortic atherosclerosis. Electronically Signed   By: David  SwazilandJordan M.D.   On: 12/10/2017 13:42     Disposition: Discharge disposition: 01-Home or Self Care       Discharge Instructions    Call MD / Call 911   Complete by:  As directed    If you experience chest pain or shortness of breath, CALL 911 and be transported to the hospital emergency room.  If you develope a fever above 101 F, pus (white drainage) or increased drainage or  redness at the wound, or calf pain, call your surgeon's office.   Change dressing   Complete by:  As directed    Change dressing if > 40% drainage in window   Constipation Prevention   Complete by:  As directed    Drink plenty of fluids.  Prune juice may be helpful.  You may use a stool softener, such as Colace (over the counter) 100 mg twice a day.  Use MiraLax (over the counter) for constipation as needed.   Diet - low sodium heart healthy   Complete by:  As directed    Increase activity slowly as tolerated   Complete by:  As directed       Follow-up Information    Gean Birchwoodowan, Devontaye Ground, MD Follow up in 2 week(s).   Specialty:  Orthopedic Surgery Contact information: [address] [place] [address] [phone]            Signed: Nestor LewandowskyFrank J Zenas Santa 12/18/2017, 6:54 AM

## 2017-12-18 NOTE — Progress Notes (Signed)
PATIENT ID: Paula FillersKaren Teng  MRN: [id number]  DOB/AGE:  65-04-1951 / 67 y.o.  1 Day Post-Op Procedure(s) (LRB): RIGHT TOTAL KNEE ARTHROPLASTY (Right)    PROGRESS NOTE Subjective: Patient is alert, oriented, no Nausea, no Vomiting, yes passing gas. Taking PO well. Denies SOB, Chest or Calf Pain. Using Incentive Spirometer, PAS in place. Ambulate 70', Patient reports pain as 1/10 .    Objective: Vital signs in last 24 hours: Vitals:   12/17/17 1825 12/17/17 2106 12/18/17 0120 12/18/17 0522  BP: (Abnormal) 85/63 115/77 (Abnormal) 104/59 (Abnormal) 114/54  Pulse: 90 87 68 70  Resp:  16 16 16   Temp: 98 F (36.7 C) 99.3 F (37.4 C) 97.7 F (36.5 C) 98.1 F (36.7 C)  TempSrc: Oral Oral Oral Oral  SpO2: 97% 95% 95% 95%  Weight:      Height:          Intake/Output from previous day: I/O last 3 completed shifts: In: 2669.4 [P.O.:480; I.V.:1889.4; IV Piggyback:300] Out: 1475 [Urine:1425; Blood:50]   Intake/Output this shift: Total I/O In: 1540 [P.O.:340; I.V.:1200] Out: 2100 [Urine:2100]   LABORATORY DATA: Recent Labs    12/18/17 0516  WBC 14.3*  HGB 10.6*  HCT 32.6*  PLT 130*  NA 141  K 4.8  CL 111  CO2 24  BUN 14  CREATININE 0.56  GLUCOSE 169*  CALCIUM 8.9    Examination: Neurologically intact ABD soft Neurovascular intact Sensation intact distally Intact pulses distally Dorsiflexion/Plantar flexion intact Incision: dressing C/D/I No cellulitis present Compartment soft}  Assessment:   1 Day Post-Op Procedure(s) (LRB): RIGHT TOTAL KNEE ARTHROPLASTY (Right) ADDITIONAL DIAGNOSIS: Expected Acute Blood Loss Anemia,   Patient's anticipated LOS is less than 2 midnights, meeting these requirements: - Younger than 4865 - Lives within 1 hour of care - Has a competent adult at home to recover with post-op recover - NO history of  - Chronic pain requiring opiods  - Diabetes  - Coronary Artery Disease  - Heart failure  - Heart attack  - Stroke  - DVT/VTE  -  Cardiac arrhythmia  - Respiratory Failure/COPD  - Renal failure  - Anemia  - Advanced Liver disease      Plan: PT/OT WBAT, AROM and PROM  DVT Prophylaxis:  SCDx72hrs, ASA 81 mg BID x 2 weeks DISCHARGE PLAN: Home, today DISCHARGE NEEDS: HHPT, Walker and 3-in-1 comode seat     Nestor LewandowskyFrank J Jovonta Levit 12/18/2017, 6:44 AM

## 2017-12-21 DIAGNOSIS — Z87891 Personal history of nicotine dependence: Secondary | ICD-10-CM | POA: Diagnosis not present

## 2017-12-21 DIAGNOSIS — Z471 Aftercare following joint replacement surgery: Secondary | ICD-10-CM | POA: Diagnosis not present

## 2017-12-21 DIAGNOSIS — Z96651 Presence of right artificial knee joint: Secondary | ICD-10-CM | POA: Diagnosis not present

## 2017-12-21 DIAGNOSIS — Z7901 Long term (current) use of anticoagulants: Secondary | ICD-10-CM | POA: Diagnosis not present

## 2018-01-01 DIAGNOSIS — M1711 Unilateral primary osteoarthritis, right knee: Secondary | ICD-10-CM | POA: Diagnosis not present

## 2018-01-03 DIAGNOSIS — Z471 Aftercare following joint replacement surgery: Secondary | ICD-10-CM | POA: Diagnosis not present

## 2018-01-03 DIAGNOSIS — M25561 Pain in right knee: Secondary | ICD-10-CM | POA: Diagnosis not present

## 2018-01-03 DIAGNOSIS — Z96651 Presence of right artificial knee joint: Secondary | ICD-10-CM | POA: Diagnosis not present

## 2018-01-08 DIAGNOSIS — Z471 Aftercare following joint replacement surgery: Secondary | ICD-10-CM | POA: Diagnosis not present

## 2018-01-08 DIAGNOSIS — M25561 Pain in right knee: Secondary | ICD-10-CM | POA: Diagnosis not present

## 2018-01-08 DIAGNOSIS — Z96651 Presence of right artificial knee joint: Secondary | ICD-10-CM | POA: Diagnosis not present

## 2018-01-10 DIAGNOSIS — Z471 Aftercare following joint replacement surgery: Secondary | ICD-10-CM | POA: Diagnosis not present

## 2018-01-10 DIAGNOSIS — M25561 Pain in right knee: Secondary | ICD-10-CM | POA: Diagnosis not present

## 2018-01-10 DIAGNOSIS — Z96651 Presence of right artificial knee joint: Secondary | ICD-10-CM | POA: Diagnosis not present

## 2018-01-14 DIAGNOSIS — M25561 Pain in right knee: Secondary | ICD-10-CM | POA: Diagnosis not present

## 2018-01-14 DIAGNOSIS — Z96651 Presence of right artificial knee joint: Secondary | ICD-10-CM | POA: Diagnosis not present

## 2018-01-14 DIAGNOSIS — Z471 Aftercare following joint replacement surgery: Secondary | ICD-10-CM | POA: Diagnosis not present

## 2018-01-14 DIAGNOSIS — R69 Illness, unspecified: Secondary | ICD-10-CM | POA: Diagnosis not present

## 2018-01-16 DIAGNOSIS — Z96651 Presence of right artificial knee joint: Secondary | ICD-10-CM | POA: Diagnosis not present

## 2018-01-16 DIAGNOSIS — Z471 Aftercare following joint replacement surgery: Secondary | ICD-10-CM | POA: Diagnosis not present

## 2018-01-16 DIAGNOSIS — M25561 Pain in right knee: Secondary | ICD-10-CM | POA: Diagnosis not present

## 2018-01-18 DIAGNOSIS — L2084 Intrinsic (allergic) eczema: Secondary | ICD-10-CM | POA: Diagnosis not present

## 2018-01-18 DIAGNOSIS — L80 Vitiligo: Secondary | ICD-10-CM | POA: Diagnosis not present

## 2018-01-21 DIAGNOSIS — Z96651 Presence of right artificial knee joint: Secondary | ICD-10-CM | POA: Diagnosis not present

## 2018-01-21 DIAGNOSIS — R69 Illness, unspecified: Secondary | ICD-10-CM | POA: Diagnosis not present

## 2018-01-21 DIAGNOSIS — M25561 Pain in right knee: Secondary | ICD-10-CM | POA: Diagnosis not present

## 2018-01-21 DIAGNOSIS — Z471 Aftercare following joint replacement surgery: Secondary | ICD-10-CM | POA: Diagnosis not present

## 2018-01-22 DIAGNOSIS — R69 Illness, unspecified: Secondary | ICD-10-CM | POA: Diagnosis not present

## 2018-01-23 DIAGNOSIS — M25561 Pain in right knee: Secondary | ICD-10-CM | POA: Diagnosis not present

## 2018-01-23 DIAGNOSIS — Z96651 Presence of right artificial knee joint: Secondary | ICD-10-CM | POA: Diagnosis not present

## 2018-01-23 DIAGNOSIS — Z471 Aftercare following joint replacement surgery: Secondary | ICD-10-CM | POA: Diagnosis not present

## 2018-01-28 DIAGNOSIS — R69 Illness, unspecified: Secondary | ICD-10-CM | POA: Diagnosis not present

## 2018-01-29 DIAGNOSIS — Z96651 Presence of right artificial knee joint: Secondary | ICD-10-CM | POA: Diagnosis not present

## 2018-01-29 DIAGNOSIS — R69 Illness, unspecified: Secondary | ICD-10-CM | POA: Diagnosis not present

## 2018-01-29 DIAGNOSIS — Z471 Aftercare following joint replacement surgery: Secondary | ICD-10-CM | POA: Diagnosis not present

## 2018-01-29 DIAGNOSIS — M1712 Unilateral primary osteoarthritis, left knee: Secondary | ICD-10-CM | POA: Diagnosis not present

## 2018-01-29 DIAGNOSIS — M25561 Pain in right knee: Secondary | ICD-10-CM | POA: Diagnosis not present

## 2018-01-31 DIAGNOSIS — Z96651 Presence of right artificial knee joint: Secondary | ICD-10-CM | POA: Diagnosis not present

## 2018-01-31 DIAGNOSIS — Z471 Aftercare following joint replacement surgery: Secondary | ICD-10-CM | POA: Diagnosis not present

## 2018-01-31 DIAGNOSIS — M25561 Pain in right knee: Secondary | ICD-10-CM | POA: Diagnosis not present

## 2018-02-04 DIAGNOSIS — R69 Illness, unspecified: Secondary | ICD-10-CM | POA: Diagnosis not present

## 2018-02-06 DIAGNOSIS — Z96651 Presence of right artificial knee joint: Secondary | ICD-10-CM | POA: Diagnosis not present

## 2018-02-06 DIAGNOSIS — Z471 Aftercare following joint replacement surgery: Secondary | ICD-10-CM | POA: Diagnosis not present

## 2018-02-06 DIAGNOSIS — M25561 Pain in right knee: Secondary | ICD-10-CM | POA: Diagnosis not present

## 2018-02-11 DIAGNOSIS — Z471 Aftercare following joint replacement surgery: Secondary | ICD-10-CM | POA: Diagnosis not present

## 2018-02-11 DIAGNOSIS — R69 Illness, unspecified: Secondary | ICD-10-CM | POA: Diagnosis not present

## 2018-02-11 DIAGNOSIS — M25561 Pain in right knee: Secondary | ICD-10-CM | POA: Diagnosis not present

## 2018-02-11 DIAGNOSIS — Z96651 Presence of right artificial knee joint: Secondary | ICD-10-CM | POA: Diagnosis not present

## 2018-02-18 DIAGNOSIS — R69 Illness, unspecified: Secondary | ICD-10-CM | POA: Diagnosis not present

## 2018-02-21 DIAGNOSIS — R69 Illness, unspecified: Secondary | ICD-10-CM | POA: Diagnosis not present

## 2018-02-25 DIAGNOSIS — R69 Illness, unspecified: Secondary | ICD-10-CM | POA: Diagnosis not present

## 2018-02-28 DIAGNOSIS — M25561 Pain in right knee: Secondary | ICD-10-CM | POA: Diagnosis not present

## 2018-02-28 DIAGNOSIS — Z96651 Presence of right artificial knee joint: Secondary | ICD-10-CM | POA: Diagnosis not present

## 2018-03-08 DIAGNOSIS — H524 Presbyopia: Secondary | ICD-10-CM | POA: Diagnosis not present

## 2018-03-08 DIAGNOSIS — H43812 Vitreous degeneration, left eye: Secondary | ICD-10-CM | POA: Diagnosis not present

## 2018-03-11 DIAGNOSIS — R69 Illness, unspecified: Secondary | ICD-10-CM | POA: Diagnosis not present

## 2018-03-15 DIAGNOSIS — H04123 Dry eye syndrome of bilateral lacrimal glands: Secondary | ICD-10-CM | POA: Diagnosis not present

## 2018-03-15 DIAGNOSIS — H35073 Retinal telangiectasis, bilateral: Secondary | ICD-10-CM | POA: Diagnosis not present

## 2018-03-15 DIAGNOSIS — M1712 Unilateral primary osteoarthritis, left knee: Secondary | ICD-10-CM | POA: Diagnosis not present

## 2018-03-18 DIAGNOSIS — R69 Illness, unspecified: Secondary | ICD-10-CM | POA: Diagnosis not present

## 2018-03-20 DIAGNOSIS — R112 Nausea with vomiting, unspecified: Secondary | ICD-10-CM | POA: Diagnosis not present

## 2018-03-25 DIAGNOSIS — R69 Illness, unspecified: Secondary | ICD-10-CM | POA: Diagnosis not present

## 2018-03-29 DIAGNOSIS — H2511 Age-related nuclear cataract, right eye: Secondary | ICD-10-CM | POA: Diagnosis not present

## 2018-04-01 DIAGNOSIS — R69 Illness, unspecified: Secondary | ICD-10-CM | POA: Diagnosis not present

## 2018-04-08 DIAGNOSIS — R69 Illness, unspecified: Secondary | ICD-10-CM | POA: Diagnosis not present

## 2018-04-15 DIAGNOSIS — R69 Illness, unspecified: Secondary | ICD-10-CM | POA: Diagnosis not present

## 2018-04-18 DIAGNOSIS — R69 Illness, unspecified: Secondary | ICD-10-CM | POA: Diagnosis not present

## 2018-04-22 DIAGNOSIS — R69 Illness, unspecified: Secondary | ICD-10-CM | POA: Diagnosis not present

## 2018-05-03 DIAGNOSIS — Z01818 Encounter for other preprocedural examination: Secondary | ICD-10-CM | POA: Diagnosis not present

## 2018-05-03 DIAGNOSIS — H2511 Age-related nuclear cataract, right eye: Secondary | ICD-10-CM | POA: Diagnosis not present

## 2018-05-03 DIAGNOSIS — H25811 Combined forms of age-related cataract, right eye: Secondary | ICD-10-CM | POA: Diagnosis not present

## 2018-05-13 DIAGNOSIS — R69 Illness, unspecified: Secondary | ICD-10-CM | POA: Diagnosis not present

## 2018-05-20 DIAGNOSIS — R69 Illness, unspecified: Secondary | ICD-10-CM | POA: Diagnosis not present

## 2018-05-22 DIAGNOSIS — H25812 Combined forms of age-related cataract, left eye: Secondary | ICD-10-CM | POA: Diagnosis not present

## 2018-05-22 DIAGNOSIS — H2512 Age-related nuclear cataract, left eye: Secondary | ICD-10-CM | POA: Diagnosis not present

## 2018-05-27 DIAGNOSIS — D696 Thrombocytopenia, unspecified: Secondary | ICD-10-CM | POA: Diagnosis not present

## 2018-05-27 DIAGNOSIS — D649 Anemia, unspecified: Secondary | ICD-10-CM | POA: Diagnosis not present

## 2018-05-27 DIAGNOSIS — H359 Unspecified retinal disorder: Secondary | ICD-10-CM | POA: Diagnosis not present

## 2018-05-27 DIAGNOSIS — E7849 Other hyperlipidemia: Secondary | ICD-10-CM | POA: Diagnosis not present

## 2018-05-27 DIAGNOSIS — Z800 Family history of malignant neoplasm of digestive organs: Secondary | ICD-10-CM | POA: Diagnosis not present

## 2018-05-27 DIAGNOSIS — Z1331 Encounter for screening for depression: Secondary | ICD-10-CM | POA: Diagnosis not present

## 2018-05-27 DIAGNOSIS — R69 Illness, unspecified: Secondary | ICD-10-CM | POA: Diagnosis not present

## 2018-05-27 DIAGNOSIS — Z6828 Body mass index (BMI) 28.0-28.9, adult: Secondary | ICD-10-CM | POA: Diagnosis not present

## 2018-06-03 DIAGNOSIS — R69 Illness, unspecified: Secondary | ICD-10-CM | POA: Diagnosis not present

## 2018-06-10 DIAGNOSIS — R69 Illness, unspecified: Secondary | ICD-10-CM | POA: Diagnosis not present

## 2018-06-11 DIAGNOSIS — Z96651 Presence of right artificial knee joint: Secondary | ICD-10-CM | POA: Diagnosis not present

## 2018-06-11 DIAGNOSIS — M1712 Unilateral primary osteoarthritis, left knee: Secondary | ICD-10-CM | POA: Diagnosis not present

## 2018-06-11 DIAGNOSIS — M1711 Unilateral primary osteoarthritis, right knee: Secondary | ICD-10-CM | POA: Diagnosis not present

## 2018-06-11 DIAGNOSIS — M25561 Pain in right knee: Secondary | ICD-10-CM | POA: Diagnosis not present

## 2018-06-17 DIAGNOSIS — R69 Illness, unspecified: Secondary | ICD-10-CM | POA: Diagnosis not present

## 2018-06-20 DIAGNOSIS — R69 Illness, unspecified: Secondary | ICD-10-CM | POA: Diagnosis not present

## 2018-06-20 DIAGNOSIS — M25561 Pain in right knee: Secondary | ICD-10-CM | POA: Diagnosis not present

## 2018-06-20 DIAGNOSIS — M1712 Unilateral primary osteoarthritis, left knee: Secondary | ICD-10-CM | POA: Diagnosis not present

## 2018-06-24 DIAGNOSIS — R69 Illness, unspecified: Secondary | ICD-10-CM | POA: Diagnosis not present

## 2018-07-01 DIAGNOSIS — R69 Illness, unspecified: Secondary | ICD-10-CM | POA: Diagnosis not present

## 2018-07-08 DIAGNOSIS — R69 Illness, unspecified: Secondary | ICD-10-CM | POA: Diagnosis not present

## 2018-07-11 DIAGNOSIS — M1712 Unilateral primary osteoarthritis, left knee: Secondary | ICD-10-CM | POA: Diagnosis not present

## 2018-07-23 DIAGNOSIS — M1712 Unilateral primary osteoarthritis, left knee: Secondary | ICD-10-CM | POA: Diagnosis not present

## 2018-07-30 DIAGNOSIS — M1712 Unilateral primary osteoarthritis, left knee: Secondary | ICD-10-CM | POA: Diagnosis not present

## 2018-08-08 DIAGNOSIS — R69 Illness, unspecified: Secondary | ICD-10-CM | POA: Diagnosis not present

## 2018-09-12 DIAGNOSIS — R69 Illness, unspecified: Secondary | ICD-10-CM | POA: Diagnosis not present

## 2018-09-20 DIAGNOSIS — R69 Illness, unspecified: Secondary | ICD-10-CM | POA: Diagnosis not present

## 2018-10-17 DIAGNOSIS — R69 Illness, unspecified: Secondary | ICD-10-CM | POA: Diagnosis not present

## 2018-11-25 DIAGNOSIS — Z1331 Encounter for screening for depression: Secondary | ICD-10-CM | POA: Diagnosis not present

## 2018-11-25 DIAGNOSIS — N3281 Overactive bladder: Secondary | ICD-10-CM | POA: Diagnosis not present

## 2018-11-25 DIAGNOSIS — H359 Unspecified retinal disorder: Secondary | ICD-10-CM | POA: Diagnosis not present

## 2018-11-25 DIAGNOSIS — D649 Anemia, unspecified: Secondary | ICD-10-CM | POA: Diagnosis not present

## 2018-11-25 DIAGNOSIS — E785 Hyperlipidemia, unspecified: Secondary | ICD-10-CM | POA: Diagnosis not present

## 2018-11-25 DIAGNOSIS — D696 Thrombocytopenia, unspecified: Secondary | ICD-10-CM | POA: Diagnosis not present

## 2018-11-25 DIAGNOSIS — R69 Illness, unspecified: Secondary | ICD-10-CM | POA: Diagnosis not present

## 2018-11-26 ENCOUNTER — Other Ambulatory Visit: Payer: Self-pay | Admitting: Endocrinology

## 2018-11-26 DIAGNOSIS — E7849 Other hyperlipidemia: Secondary | ICD-10-CM | POA: Diagnosis not present

## 2018-11-26 DIAGNOSIS — Z1231 Encounter for screening mammogram for malignant neoplasm of breast: Secondary | ICD-10-CM

## 2018-12-12 DIAGNOSIS — R69 Illness, unspecified: Secondary | ICD-10-CM | POA: Diagnosis not present

## 2018-12-24 DIAGNOSIS — M25572 Pain in left ankle and joints of left foot: Secondary | ICD-10-CM | POA: Diagnosis not present

## 2018-12-24 DIAGNOSIS — M25561 Pain in right knee: Secondary | ICD-10-CM | POA: Diagnosis not present

## 2019-01-10 ENCOUNTER — Other Ambulatory Visit: Payer: Self-pay

## 2019-01-10 ENCOUNTER — Ambulatory Visit
Admission: RE | Admit: 2019-01-10 | Discharge: 2019-01-10 | Disposition: A | Discharge status: Home / Self Care | Payer: Medicare HMO | Source: Ambulatory Visit | Attending: Endocrinology | Admitting: Endocrinology

## 2019-01-10 DIAGNOSIS — Z1231 Encounter for screening mammogram for malignant neoplasm of breast: Secondary | ICD-10-CM

## 2019-01-16 DIAGNOSIS — R69 Illness, unspecified: Secondary | ICD-10-CM | POA: Diagnosis not present

## 2019-01-19 DIAGNOSIS — R69 Illness, unspecified: Secondary | ICD-10-CM | POA: Diagnosis not present

## 2019-03-13 DIAGNOSIS — H524 Presbyopia: Secondary | ICD-10-CM | POA: Diagnosis not present

## 2019-03-14 DIAGNOSIS — R69 Illness, unspecified: Secondary | ICD-10-CM | POA: Diagnosis not present

## 2019-04-15 DIAGNOSIS — M1712 Unilateral primary osteoarthritis, left knee: Secondary | ICD-10-CM | POA: Diagnosis not present

## 2019-05-23 DIAGNOSIS — E785 Hyperlipidemia, unspecified: Secondary | ICD-10-CM | POA: Diagnosis not present

## 2019-05-23 DIAGNOSIS — D649 Anemia, unspecified: Secondary | ICD-10-CM | POA: Diagnosis not present

## 2019-05-23 DIAGNOSIS — M25562 Pain in left knee: Secondary | ICD-10-CM | POA: Diagnosis not present

## 2019-05-23 DIAGNOSIS — I700 Atherosclerosis of aorta: Secondary | ICD-10-CM | POA: Diagnosis not present

## 2019-05-23 DIAGNOSIS — N3281 Overactive bladder: Secondary | ICD-10-CM | POA: Diagnosis not present

## 2019-05-23 DIAGNOSIS — D696 Thrombocytopenia, unspecified: Secondary | ICD-10-CM | POA: Diagnosis not present

## 2019-05-23 DIAGNOSIS — Z1331 Encounter for screening for depression: Secondary | ICD-10-CM | POA: Diagnosis not present

## 2019-05-26 DIAGNOSIS — R69 Illness, unspecified: Secondary | ICD-10-CM | POA: Diagnosis not present

## 2019-05-27 DIAGNOSIS — D6489 Other specified anemias: Secondary | ICD-10-CM | POA: Diagnosis not present

## 2019-05-27 DIAGNOSIS — E7849 Other hyperlipidemia: Secondary | ICD-10-CM | POA: Diagnosis not present

## 2019-06-27 DIAGNOSIS — E669 Obesity, unspecified: Secondary | ICD-10-CM | POA: Diagnosis not present

## 2019-06-27 DIAGNOSIS — N3281 Overactive bladder: Secondary | ICD-10-CM | POA: Diagnosis not present

## 2019-06-27 DIAGNOSIS — Z7901 Long term (current) use of anticoagulants: Secondary | ICD-10-CM | POA: Diagnosis not present

## 2019-06-27 DIAGNOSIS — Z01812 Encounter for preprocedural laboratory examination: Secondary | ICD-10-CM | POA: Diagnosis not present

## 2019-06-27 DIAGNOSIS — E785 Hyperlipidemia, unspecified: Secondary | ICD-10-CM | POA: Diagnosis not present

## 2019-06-27 DIAGNOSIS — M179 Osteoarthritis of knee, unspecified: Secondary | ICD-10-CM | POA: Diagnosis not present

## 2019-07-03 DIAGNOSIS — R69 Illness, unspecified: Secondary | ICD-10-CM | POA: Diagnosis not present

## 2019-07-15 DIAGNOSIS — M1712 Unilateral primary osteoarthritis, left knee: Secondary | ICD-10-CM | POA: Diagnosis not present

## 2019-07-25 DIAGNOSIS — M1712 Unilateral primary osteoarthritis, left knee: Secondary | ICD-10-CM | POA: Diagnosis not present

## 2019-07-25 DIAGNOSIS — G8918 Other acute postprocedural pain: Secondary | ICD-10-CM | POA: Diagnosis not present

## 2019-07-26 DIAGNOSIS — Z471 Aftercare following joint replacement surgery: Secondary | ICD-10-CM | POA: Diagnosis not present

## 2019-07-26 DIAGNOSIS — R69 Illness, unspecified: Secondary | ICD-10-CM | POA: Diagnosis not present

## 2019-07-26 DIAGNOSIS — E785 Hyperlipidemia, unspecified: Secondary | ICD-10-CM | POA: Diagnosis not present

## 2019-07-26 DIAGNOSIS — Z79891 Long term (current) use of opiate analgesic: Secondary | ICD-10-CM | POA: Diagnosis not present

## 2019-07-26 DIAGNOSIS — Z96653 Presence of artificial knee joint, bilateral: Secondary | ICD-10-CM | POA: Diagnosis not present

## 2019-07-26 DIAGNOSIS — Z7982 Long term (current) use of aspirin: Secondary | ICD-10-CM | POA: Diagnosis not present

## 2019-07-28 DIAGNOSIS — Z471 Aftercare following joint replacement surgery: Secondary | ICD-10-CM | POA: Diagnosis not present

## 2019-07-28 DIAGNOSIS — Z96653 Presence of artificial knee joint, bilateral: Secondary | ICD-10-CM | POA: Diagnosis not present

## 2019-07-28 DIAGNOSIS — M1712 Unilateral primary osteoarthritis, left knee: Secondary | ICD-10-CM | POA: Diagnosis not present

## 2019-07-28 DIAGNOSIS — E785 Hyperlipidemia, unspecified: Secondary | ICD-10-CM | POA: Diagnosis not present

## 2019-07-28 DIAGNOSIS — Z7982 Long term (current) use of aspirin: Secondary | ICD-10-CM | POA: Diagnosis not present

## 2019-07-28 DIAGNOSIS — Z96652 Presence of left artificial knee joint: Secondary | ICD-10-CM | POA: Diagnosis not present

## 2019-07-28 DIAGNOSIS — R69 Illness, unspecified: Secondary | ICD-10-CM | POA: Diagnosis not present

## 2019-07-28 DIAGNOSIS — Z79891 Long term (current) use of opiate analgesic: Secondary | ICD-10-CM | POA: Diagnosis not present

## 2019-07-30 DIAGNOSIS — Z471 Aftercare following joint replacement surgery: Secondary | ICD-10-CM | POA: Diagnosis not present

## 2019-07-30 DIAGNOSIS — R69 Illness, unspecified: Secondary | ICD-10-CM | POA: Diagnosis not present

## 2019-07-30 DIAGNOSIS — Z7982 Long term (current) use of aspirin: Secondary | ICD-10-CM | POA: Diagnosis not present

## 2019-07-30 DIAGNOSIS — Z79891 Long term (current) use of opiate analgesic: Secondary | ICD-10-CM | POA: Diagnosis not present

## 2019-07-30 DIAGNOSIS — E785 Hyperlipidemia, unspecified: Secondary | ICD-10-CM | POA: Diagnosis not present

## 2019-07-30 DIAGNOSIS — Z96653 Presence of artificial knee joint, bilateral: Secondary | ICD-10-CM | POA: Diagnosis not present

## 2019-07-31 DIAGNOSIS — M25662 Stiffness of left knee, not elsewhere classified: Secondary | ICD-10-CM | POA: Diagnosis not present

## 2019-07-31 DIAGNOSIS — Z96652 Presence of left artificial knee joint: Secondary | ICD-10-CM | POA: Diagnosis not present

## 2019-07-31 DIAGNOSIS — M25562 Pain in left knee: Secondary | ICD-10-CM | POA: Diagnosis not present

## 2019-08-04 DIAGNOSIS — M25662 Stiffness of left knee, not elsewhere classified: Secondary | ICD-10-CM | POA: Diagnosis not present

## 2019-08-04 DIAGNOSIS — Z96652 Presence of left artificial knee joint: Secondary | ICD-10-CM | POA: Diagnosis not present

## 2019-08-06 DIAGNOSIS — M25662 Stiffness of left knee, not elsewhere classified: Secondary | ICD-10-CM | POA: Diagnosis not present

## 2019-08-06 DIAGNOSIS — Z96642 Presence of left artificial hip joint: Secondary | ICD-10-CM | POA: Diagnosis not present

## 2019-08-11 DIAGNOSIS — M25662 Stiffness of left knee, not elsewhere classified: Secondary | ICD-10-CM | POA: Diagnosis not present

## 2019-08-11 DIAGNOSIS — Z96652 Presence of left artificial knee joint: Secondary | ICD-10-CM | POA: Diagnosis not present

## 2019-08-13 DIAGNOSIS — Z96652 Presence of left artificial knee joint: Secondary | ICD-10-CM | POA: Diagnosis not present

## 2019-08-13 DIAGNOSIS — M25662 Stiffness of left knee, not elsewhere classified: Secondary | ICD-10-CM | POA: Diagnosis not present

## 2019-08-18 DIAGNOSIS — M25662 Stiffness of left knee, not elsewhere classified: Secondary | ICD-10-CM | POA: Diagnosis not present

## 2019-08-18 DIAGNOSIS — Z96652 Presence of left artificial knee joint: Secondary | ICD-10-CM | POA: Diagnosis not present

## 2019-08-21 DIAGNOSIS — M25662 Stiffness of left knee, not elsewhere classified: Secondary | ICD-10-CM | POA: Diagnosis not present

## 2019-08-21 DIAGNOSIS — Z96652 Presence of left artificial knee joint: Secondary | ICD-10-CM | POA: Diagnosis not present

## 2019-08-25 DIAGNOSIS — M25662 Stiffness of left knee, not elsewhere classified: Secondary | ICD-10-CM | POA: Diagnosis not present

## 2019-08-25 DIAGNOSIS — Z96652 Presence of left artificial knee joint: Secondary | ICD-10-CM | POA: Diagnosis not present

## 2019-08-28 DIAGNOSIS — M25662 Stiffness of left knee, not elsewhere classified: Secondary | ICD-10-CM | POA: Diagnosis not present

## 2019-08-28 DIAGNOSIS — Z96652 Presence of left artificial knee joint: Secondary | ICD-10-CM | POA: Diagnosis not present

## 2019-09-01 DIAGNOSIS — M25662 Stiffness of left knee, not elsewhere classified: Secondary | ICD-10-CM | POA: Diagnosis not present

## 2019-09-01 DIAGNOSIS — Z96652 Presence of left artificial knee joint: Secondary | ICD-10-CM | POA: Diagnosis not present

## 2019-09-05 DIAGNOSIS — M25662 Stiffness of left knee, not elsewhere classified: Secondary | ICD-10-CM | POA: Diagnosis not present

## 2019-09-05 DIAGNOSIS — Z96652 Presence of left artificial knee joint: Secondary | ICD-10-CM | POA: Diagnosis not present

## 2019-09-09 DIAGNOSIS — M25662 Stiffness of left knee, not elsewhere classified: Secondary | ICD-10-CM | POA: Diagnosis not present

## 2019-09-09 DIAGNOSIS — Z96652 Presence of left artificial knee joint: Secondary | ICD-10-CM | POA: Diagnosis not present

## 2019-09-11 DIAGNOSIS — Z96652 Presence of left artificial knee joint: Secondary | ICD-10-CM | POA: Diagnosis not present

## 2019-09-11 DIAGNOSIS — M25662 Stiffness of left knee, not elsewhere classified: Secondary | ICD-10-CM | POA: Diagnosis not present

## 2019-09-16 DIAGNOSIS — Z96652 Presence of left artificial knee joint: Secondary | ICD-10-CM | POA: Diagnosis not present

## 2019-09-16 DIAGNOSIS — M25662 Stiffness of left knee, not elsewhere classified: Secondary | ICD-10-CM | POA: Diagnosis not present

## 2019-09-18 DIAGNOSIS — Z96652 Presence of left artificial knee joint: Secondary | ICD-10-CM | POA: Diagnosis not present

## 2019-09-18 DIAGNOSIS — M25662 Stiffness of left knee, not elsewhere classified: Secondary | ICD-10-CM | POA: Diagnosis not present

## 2019-09-23 DIAGNOSIS — Z96652 Presence of left artificial knee joint: Secondary | ICD-10-CM | POA: Diagnosis not present

## 2019-09-23 DIAGNOSIS — M25662 Stiffness of left knee, not elsewhere classified: Secondary | ICD-10-CM | POA: Diagnosis not present

## 2019-09-26 DIAGNOSIS — Z96652 Presence of left artificial knee joint: Secondary | ICD-10-CM | POA: Diagnosis not present

## 2019-09-26 DIAGNOSIS — R69 Illness, unspecified: Secondary | ICD-10-CM | POA: Diagnosis not present

## 2019-09-26 DIAGNOSIS — M25662 Stiffness of left knee, not elsewhere classified: Secondary | ICD-10-CM | POA: Diagnosis not present

## 2019-10-14 DIAGNOSIS — Z96652 Presence of left artificial knee joint: Secondary | ICD-10-CM | POA: Diagnosis not present

## 2019-10-14 DIAGNOSIS — M25662 Stiffness of left knee, not elsewhere classified: Secondary | ICD-10-CM | POA: Diagnosis not present

## 2019-11-24 DIAGNOSIS — R5381 Other malaise: Secondary | ICD-10-CM | POA: Diagnosis not present

## 2019-11-24 DIAGNOSIS — N3281 Overactive bladder: Secondary | ICD-10-CM | POA: Diagnosis not present

## 2019-11-24 DIAGNOSIS — R69 Illness, unspecified: Secondary | ICD-10-CM | POA: Diagnosis not present

## 2019-11-24 DIAGNOSIS — D649 Anemia, unspecified: Secondary | ICD-10-CM | POA: Diagnosis not present

## 2019-11-24 DIAGNOSIS — Z1331 Encounter for screening for depression: Secondary | ICD-10-CM | POA: Diagnosis not present

## 2019-11-24 DIAGNOSIS — K219 Gastro-esophageal reflux disease without esophagitis: Secondary | ICD-10-CM | POA: Diagnosis not present

## 2019-11-24 DIAGNOSIS — I700 Atherosclerosis of aorta: Secondary | ICD-10-CM | POA: Diagnosis not present

## 2019-11-24 DIAGNOSIS — F329 Major depressive disorder, single episode, unspecified: Secondary | ICD-10-CM | POA: Diagnosis not present

## 2019-11-24 DIAGNOSIS — E669 Obesity, unspecified: Secondary | ICD-10-CM | POA: Diagnosis not present

## 2019-11-24 DIAGNOSIS — E7849 Other hyperlipidemia: Secondary | ICD-10-CM | POA: Diagnosis not present

## 2019-11-24 DIAGNOSIS — R05 Cough: Secondary | ICD-10-CM | POA: Diagnosis not present

## 2019-12-09 ENCOUNTER — Other Ambulatory Visit: Payer: Self-pay | Admitting: Endocrinology

## 2019-12-09 DIAGNOSIS — Z1231 Encounter for screening mammogram for malignant neoplasm of breast: Secondary | ICD-10-CM

## 2019-12-24 DIAGNOSIS — Z20822 Contact with and (suspected) exposure to covid-19: Secondary | ICD-10-CM | POA: Diagnosis not present

## 2019-12-24 DIAGNOSIS — Z03818 Encounter for observation for suspected exposure to other biological agents ruled out: Secondary | ICD-10-CM | POA: Diagnosis not present

## 2019-12-30 DIAGNOSIS — R69 Illness, unspecified: Secondary | ICD-10-CM | POA: Diagnosis not present

## 2020-01-02 DIAGNOSIS — R69 Illness, unspecified: Secondary | ICD-10-CM | POA: Diagnosis not present

## 2020-01-13 ENCOUNTER — Other Ambulatory Visit: Payer: Self-pay

## 2020-01-13 ENCOUNTER — Ambulatory Visit
Admission: RE | Admit: 2020-01-13 | Discharge: 2020-01-13 | Disposition: A | Discharge status: Home / Self Care | Payer: Medicare HMO | Source: Ambulatory Visit | Attending: Endocrinology | Admitting: Endocrinology

## 2020-01-13 DIAGNOSIS — Z1231 Encounter for screening mammogram for malignant neoplasm of breast: Secondary | ICD-10-CM | POA: Diagnosis not present

## 2020-02-24 DIAGNOSIS — R69 Illness, unspecified: Secondary | ICD-10-CM | POA: Diagnosis not present

## 2020-03-25 DIAGNOSIS — Z471 Aftercare following joint replacement surgery: Secondary | ICD-10-CM | POA: Diagnosis not present

## 2020-03-25 DIAGNOSIS — Z96652 Presence of left artificial knee joint: Secondary | ICD-10-CM | POA: Diagnosis not present

## 2020-03-30 DIAGNOSIS — H5213 Myopia, bilateral: Secondary | ICD-10-CM | POA: Diagnosis not present

## 2020-04-15 DIAGNOSIS — H16143 Punctate keratitis, bilateral: Secondary | ICD-10-CM | POA: Diagnosis not present

## 2020-05-24 DIAGNOSIS — E669 Obesity, unspecified: Secondary | ICD-10-CM | POA: Diagnosis not present

## 2020-05-24 DIAGNOSIS — H9190 Unspecified hearing loss, unspecified ear: Secondary | ICD-10-CM | POA: Diagnosis not present

## 2020-05-24 DIAGNOSIS — D696 Thrombocytopenia, unspecified: Secondary | ICD-10-CM | POA: Diagnosis not present

## 2020-05-24 DIAGNOSIS — Z800 Family history of malignant neoplasm of digestive organs: Secondary | ICD-10-CM | POA: Diagnosis not present

## 2020-05-24 DIAGNOSIS — I700 Atherosclerosis of aorta: Secondary | ICD-10-CM | POA: Diagnosis not present

## 2020-05-24 DIAGNOSIS — K219 Gastro-esophageal reflux disease without esophagitis: Secondary | ICD-10-CM | POA: Diagnosis not present

## 2020-05-24 DIAGNOSIS — R0683 Snoring: Secondary | ICD-10-CM | POA: Diagnosis not present

## 2020-05-24 DIAGNOSIS — N3281 Overactive bladder: Secondary | ICD-10-CM | POA: Diagnosis not present

## 2020-05-24 DIAGNOSIS — E785 Hyperlipidemia, unspecified: Secondary | ICD-10-CM | POA: Diagnosis not present

## 2020-06-01 DIAGNOSIS — R69 Illness, unspecified: Secondary | ICD-10-CM | POA: Diagnosis not present

## 2020-06-08 DIAGNOSIS — R69 Illness, unspecified: Secondary | ICD-10-CM | POA: Diagnosis not present

## 2020-06-17 DIAGNOSIS — R059 Cough, unspecified: Secondary | ICD-10-CM | POA: Diagnosis not present

## 2020-06-17 DIAGNOSIS — R042 Hemoptysis: Secondary | ICD-10-CM | POA: Diagnosis not present

## 2020-06-17 DIAGNOSIS — J189 Pneumonia, unspecified organism: Secondary | ICD-10-CM | POA: Diagnosis not present

## 2020-06-17 DIAGNOSIS — Z87891 Personal history of nicotine dependence: Secondary | ICD-10-CM | POA: Diagnosis not present

## 2020-06-18 ENCOUNTER — Other Ambulatory Visit: Payer: Self-pay | Admitting: Family Medicine

## 2020-06-18 DIAGNOSIS — J189 Pneumonia, unspecified organism: Secondary | ICD-10-CM

## 2020-06-23 ENCOUNTER — Institutional Professional Consult (permissible substitution): Payer: Self-pay | Admitting: Neurology

## 2020-07-06 ENCOUNTER — Ambulatory Visit
Admission: RE | Admit: 2020-07-06 | Discharge: 2020-07-06 | Disposition: A | Discharge status: Home / Self Care | Payer: Medicare HMO | Source: Ambulatory Visit | Attending: Family Medicine | Admitting: Family Medicine

## 2020-07-06 DIAGNOSIS — J189 Pneumonia, unspecified organism: Secondary | ICD-10-CM

## 2020-07-06 DIAGNOSIS — Z8701 Personal history of pneumonia (recurrent): Secondary | ICD-10-CM | POA: Diagnosis not present

## 2020-07-06 DIAGNOSIS — K449 Diaphragmatic hernia without obstruction or gangrene: Secondary | ICD-10-CM | POA: Diagnosis not present

## 2020-07-06 DIAGNOSIS — I2510 Atherosclerotic heart disease of native coronary artery without angina pectoris: Secondary | ICD-10-CM | POA: Diagnosis not present

## 2020-07-06 DIAGNOSIS — J984 Other disorders of lung: Secondary | ICD-10-CM | POA: Diagnosis not present

## 2020-07-06 MED ORDER — IOPAMIDOL (ISOVUE-370) INJECTION 76%
75.0000 mL | Freq: Once | INTRAVENOUS | Status: AC | PRN
  Administered 2020-07-06: 75 mL via INTRAVENOUS

## 2020-07-22 ENCOUNTER — Ambulatory Visit: Payer: Medicare HMO | Admitting: Neurology

## 2020-07-22 ENCOUNTER — Encounter: Payer: Self-pay | Admitting: Neurology

## 2020-07-22 VITALS — BP 129/82 | HR 71 | Ht 65.5 in | Wt 190.0 lb

## 2020-07-22 DIAGNOSIS — R351 Nocturia: Secondary | ICD-10-CM | POA: Diagnosis not present

## 2020-07-22 DIAGNOSIS — R0683 Snoring: Secondary | ICD-10-CM

## 2020-07-22 DIAGNOSIS — Z820 Family history of epilepsy and other diseases of the nervous system: Secondary | ICD-10-CM | POA: Diagnosis not present

## 2020-07-22 DIAGNOSIS — E669 Obesity, unspecified: Secondary | ICD-10-CM

## 2020-07-22 NOTE — Progress Notes (Signed)
Subjective:    Patient ID: Paula Poole is a 70 y.o. female.  HPI     Huston Foley, MD, PhD [facility] [address], [address] [address] [place], [place] [address]  Dear Dr. Evlyn Kanner,   I saw your patient, Paula Poole, upon your kind request in my sleep clinic today for initial consultation of her sleep disorder, in particular, concern for underlying obstructive sleep apnea.  Patient is unaccompanied today.  As you know, Paula Poole is a 70 year old right-handed woman with an underlying medical history of hyperlipidemia, anemia, thrombocytopenia, depression, reflux disease, arthritis with status post joint replacement surgeries, overactive bladder, and mild obesity, who reports snoring.  Snoring is reportedly loud per family.  She recently had a get together with her family and her sisters told her that her snoring was loud.  The patient denies any significant sleep disruption.  Her Epworth sleepiness score is 5 out of 24, fatigue severity score is 14 out of 63.  One of her sisters has sleep apnea and has a CPAP machine.  Patient is widowed, she lives alone, she has 1 cat in the household.  She generally feels fairly well rested.  She goes to bed around 930 or 10 and rise time is between 7 and 8.  She is retired.  She is doing a lot of volunteer work.  She is widowed and moved from [place] in late 2017 after her husband passed away.  She also lost her son in 2018.  She has another son.  She quit smoking in 2018.  She drinks caffeine in the form of diet soda, 1 serving per day on average.  She has a TV in her bedroom and turns it off before falling asleep.  Her cat typically sleeps on the bed with her.  She denies recurrent morning headaches but has nocturia about once per average night.  She had a tonsillectomy as a child.  She has gained some weight in the recent past in the realm of 10 pounds in the past 1-1/2 to 2 months. She had a recent bout of pneumonia.  She did not have  to be hospitalized but was treated with antibiotics.  Her Past Medical History Is Significant For: Past Medical History:  Diagnosis Date  . Anemia    as a child  . Arthritis   . Depression    due to death of husband a year ago  . GERD (gastroesophageal reflux disease)    not for years  . High cholesterol   . Pneumonia     Her Past Surgical History Is Significant For: Past Surgical History:  Procedure Laterality Date  . ABDOMINAL HYSTERECTOMY    . BREAST BIOPSY  2008   (-) results per patient  . TONSILLECTOMY    . TOTAL HIP ARTHROPLASTY Right 11/06/2016  . TOTAL HIP ARTHROPLASTY Right 11/06/2016   Procedure: TOTAL HIP ARTHROPLASTY ANTERIOR APPROACH;  Surgeon: Gean Birchwood, MD;  Location: [facility] OR;  Service: Orthopedics;  Laterality: Right;  . TOTAL KNEE ARTHROPLASTY Right 12/17/2017   Procedure: RIGHT TOTAL KNEE ARTHROPLASTY;  Surgeon: Gean Birchwood, MD;  Location: [facility] ORS;  Service: Orthopedics;  Laterality: Right;  Needs RNFA    Her Family History Is Significant For: Family History  Problem Relation Age of Onset  . Breast cancer Sister 28  . Thyroid cancer Sister   . Lymphoma Sister   . Atrial fibrillation Sister   . CVA Mother   . Colon cancer Father   . Heart  attack Father   . High Cholesterol Father   . Diabetes Sister   . High Cholesterol Sister     Her Social History Is Significant For: Social History   Socioeconomic History  . Marital status: Divorced    Spouse name: Not on file  . Number of children: Not on file  . Years of education: Not on file  . Highest education level: Not on file  Occupational History  . Not on file  Tobacco Use  . Smoking status: Former Smoker    Quit date: 09/25/2016    Years since quitting: 3.8  . Smokeless tobacco: Never Used  Vaping Use  . Vaping Use: Never used  Substance and Sexual Activity  . Alcohol use: No  . Drug use: No  . Sexual activity: Not on file  Other Topics Concern  . Not on file  Social History Narrative   . Not on file   Social Determinants of Health   Financial Resource Strain: Not on file  Food Insecurity: Not on file  Transportation Needs: Not on file  Physical Activity: Not on file  Stress: Not on file  Social Connections: Not on file    Her Allergies Are:  Allergies  Allergen Reactions  . Nickel Hives    Cheap jewelry makes her itchy  . Psychologist, sport and exercise  . Latex Itching  :   Her Current Medications Are:  Outpatient Encounter Medications as of 07/22/2020  Medication Sig  . APPLE CIDER VINEGAR PO Take by mouth.  . ASPIRIN 81 PO Take by mouth.  . ELDERBERRY PO Take by mouth.  Marland Kitchen gemfibrozil (LOPID) 600 MG tablet Take 600 mg by mouth at bedtime.  . hydrOXYzine (VISTARIL) 25 MG capsule Take 50 mg by mouth at bedtime.  . Multiple Vitamin (MULTIVITAMIN WITH MINERALS) TABS tablet Take 1 tablet by mouth daily. Multivitamin for Women  . traZODone (DESYREL) 50 MG tablet Take 50 mg by mouth at bedtime.   . naproxen sodium (ALEVE) 220 MG tablet Take 220 mg by mouth 2 (two) times daily as needed (for pain.).  . [DISCONTINUED] amoxicillin (AMOXIL) 500 MG capsule Take 2,000 mg by mouth See admin instructions. Take 4 capsules (2000 mg) by mouth 1 hour prior to dental appointment.  . [DISCONTINUED] meloxicam (MOBIC) 15 MG tablet Take 15 mg by mouth daily.  . [DISCONTINUED] Multiple Vitamins-Minerals (AIRBORNE) CHEW Chew 1 tablet by mouth daily.  . [DISCONTINUED] OVER THE COUNTER MEDICATION Place 1 drop into both eyes daily as needed (for irritated ears). MIRACELL PROEAR   . [DISCONTINUED] sertraline (ZOLOFT) 100 MG tablet Take 100 mg by mouth daily.  . [DISCONTINUED] SYSTANE 0.4-0.3 % SOLN Place 1 drop into both eyes 3 (three) times daily as needed (for dry eyes.).  . [DISCONTINUED] tizanidine (ZANAFLEX) 2 MG capsule Take 2 capsules (4 mg total) by mouth 3 (three) times daily as needed for muscle spasms.  . [DISCONTINUED] traMADol (ULTRAM) 50 MG tablet Take 50 mg by mouth 2 (two)  times daily.   No facility-administered encounter medications on file as of 07/22/2020.  :   Review of Systems:  Out of a complete 14 point review of systems, all are reviewed and negative with the exception of these symptoms as listed below:   Review of Systems  Neurological:       Here for sleep consult. No prior sleep study pt reports, snoring is present.  Epworth Sleepiness Scale 0= would never doze 1= slight chance of dozing 2= moderate chance of dozing  3= high chance of dozing  Sitting and reading:0 Watching TV:3 Sitting inactive in a public place (ex. Theater or meeting):0 As a passenger in a car for an hour without a break:0 Lying down to rest in the afternoon:2 Sitting and talking to someone:0 Sitting quietly after lunch (no alcohol):0 In a car, while stopped in traffic:0 Total:5     Objective:  Neurological Exam  Physical Exam Physical Examination:   Vitals:   07/22/20 1048  BP: 129/82  Pulse: 71   General Examination: The patient is a very pleasant 70 y.o. in no acute distress. She appears well-developed and well-nourished and well groomed.   HEENT: Normocephalic, atraumatic, pupils are equal, round and reactive to light, extraocular tracking is good without limitation to gaze excursion or nystagmus noted. Hearing is grossly intact. Face is symmetric with normal facial animation. Speech is clear with no dysarthria noted. There is no hypophonia. There is no lip, neck/head, jaw or voice tremor. Neck is supple with full range of passive and active motion. There are no carotid bruits on auscultation. Oropharynx exam reveals: mild mouth dryness, good dental hygiene and mild airway crowding, due to small airway.  Mallampati is class I.  Tongue protrudes centrally and palate elevates symmetrically, small uvula noted, tonsils absent.  Neck circumference of 14-1/4 inches.  Chest: Clear to auscultation without wheezing, rhonchi or crackles noted.  Heart: S1+S2+0,  regular and normal without murmurs, rubs or gallops noted.   Abdomen: Soft, non-tender and non-distended with normal bowel sounds appreciated on auscultation.  Extremities: There is no pitting edema in the distal lower extremities bilaterally.   Skin: Warm and dry without trophic changes noted.   Musculoskeletal: exam reveals no obvious joint deformities, tenderness or joint swelling or erythema.   Neurologically:  Mental status: The patient is awake, alert and oriented in all 4 spheres. Her immediate and remote memory, attention, language skills and fund of knowledge are appropriate. There is no evidence of aphasia, agnosia, apraxia or anomia. Speech is clear with normal prosody and enunciation. Thought process is linear. Mood is normal and affect is normal.  Cranial nerves II - XII are as described above under HEENT exam.  Motor exam: Normal bulk, strength and tone is noted. There is no tremor. Fine motor skills and coordination: grossly intact.  Cerebellar testing: No dysmetria or intention tremor. There is no truncal or gait ataxia.  Sensory exam: intact to light touch in the upper and lower extremities.  Gait, station and balance: She stands easily. No veering to one side is noted. No leaning to one side is noted. Posture is age-appropriate and stance is narrow based. Gait shows normal stride length and decreased pace. No problems turning are noted.   Assessment and Plan:  In summary, Beautifull Cisar is a very pleasant 70 y.o.-year old female with an underlying medical history of hyperlipidemia, anemia, thrombocytopenia, depression, reflux disease, arthritis with status post joint replacement surgeries, overactive bladder, and mild obesity, who presents for evaluation of her sleep disorder.  She is primarily reporting significant snoring which has been noted by her family members.  She is not currently ready to pursue a sleep study.  She also reports still getting over a recent bout of  pneumonia.  We can certainly delay evaluation with a sleep study to a later date.  She would like to to discuss things further also with you at her next appointment which is her yearly physical pending for July 2022.   I talked to the patient  about sleep apnea, its prognosis and treatment options and the need for testing to diagnose sleep apnea.  I explained the risks and ramifications of untreated moderate to severe OSA, especially with respect to developing cardiovascular disease down the Road, including congestive heart failure, difficult to treat hypertension, cardiac arrhythmias, or stroke. Even type 2 diabetes has, in part, been linked to untreated OSA. Symptoms of untreated OSA include daytime sleepiness, memory problems, mood irritability and mood disorder such as depression and anxiety, lack of energy, as well as recurrent headaches, especially morning headaches. She was given detailed written instructions as well.  She is not quite ready to pursue a sleep study.  She is encouraged to call us back when she would like to go ahead with a sleep study.  I explained the sleep test procedure to the patient and also outlined the difference between a laboratory attended sleep study versus home sleep testing.   She is familiar with CPAP therapy as her sister has a machine.  We will pick up our discussion after testing.  She is encouraged to call when she is ready to pursue sleep testing.  I plan to see her back afterwards and for now we will see her back as needed.  I answered all her questions today and she was in agreement. Thank you very much for allowing me to participate in the care of this nice patient. If I can be of any further assistance to you please do not hesitate to call me at [phone].  Sincerely,   Huston FoleySaima Herrick Hartog, MD, PhD

## 2020-07-22 NOTE — Patient Instructions (Addendum)
Thank you for choosing [facility] for your sleep related care! It was nice to meet you today! I appreciate that you entrust me with your sleep related healthcare concerns. I hope, I was able to address at least some of your concerns today, and that I can help you feel reassured and also get better.    Here is what we discussed today and what we came up with as our plan for you:   If you change your mind, regarding pursuing a sleep study, please call us back.  You can also discuss further with your primary care physician at your next appointment in July.   Based on your symptoms and your exam I believe you are at some risk for obstructive sleep apnea (aka OSA), and I think we should proceed with a sleep study to determine whether you do or do not have OSA and how severe it is. Even, if you have mild OSA, I may want you to consider treatment with CPAP, as treatment of even borderline or mild sleep apnea can result and improvement of symptoms such as sleep disruption, daytime sleepiness, nighttime bathroom breaks, restless leg symptoms, improvement of headache syndromes, even improved mood disorder.   As explained, an attended sleep study meaning you get to stay overnight in the sleep lab, lets [place] monitor sleep-related behaviors such as sleep talking and leg movements in sleep, in addition to monitoring for sleep apnea.  A home sleep test is a screening tool for sleep apnea only, and unfortunately does not help with any other sleep-related diagnoses.  Please remember, the long-term risks and ramifications of untreated moderate to severe obstructive sleep apnea are: increased Cardiovascular disease, including congestive heart failure, stroke, difficult to control hypertension, treatment resistant obesity, arrhythmias, especially irregular heartbeat commonly known as A. Fib. (atrial fibrillation); even type 2 diabetes has been linked to untreated OSA.   Sleep apnea can cause disruption of  sleep and sleep deprivation in most cases, which, in turn, can cause recurrent headaches, problems with memory, mood, concentration, focus, and vigilance. Most people with untreated sleep apnea report excessive daytime sleepiness, which can affect their ability to drive. Please do not drive if you feel sleepy. Patients with sleep apnea can also develop difficulty initiating and maintaining sleep (aka insomnia).   Having sleep apnea may increase your risk for other sleep disorders, including involuntary behaviors sleep such as sleep terrors, sleep talking, sleepwalking.    Having sleep apnea can also increase your risk for restless leg syndrome and leg movements at night.   Please note that untreated obstructive sleep apnea may carry additional perioperative morbidity. Patients with significant obstructive sleep apnea (typically, in the moderate to severe degree) should receive, if possible, perioperative PAP (positive airway pressure) therapy and the surgeons and particularly the anesthesiologists should be informed of the diagnosis and the severity of the sleep disordered breathing.

## 2020-08-10 DIAGNOSIS — Z96652 Presence of left artificial knee joint: Secondary | ICD-10-CM | POA: Diagnosis not present

## 2020-08-10 DIAGNOSIS — Z471 Aftercare following joint replacement surgery: Secondary | ICD-10-CM | POA: Diagnosis not present

## 2020-08-23 DIAGNOSIS — R059 Cough, unspecified: Secondary | ICD-10-CM | POA: Diagnosis not present

## 2020-08-23 DIAGNOSIS — Z1152 Encounter for screening for COVID-19: Secondary | ICD-10-CM | POA: Diagnosis not present

## 2020-08-23 DIAGNOSIS — R52 Pain, unspecified: Secondary | ICD-10-CM | POA: Diagnosis not present

## 2020-08-23 DIAGNOSIS — J189 Pneumonia, unspecified organism: Secondary | ICD-10-CM | POA: Diagnosis not present

## 2020-09-13 DIAGNOSIS — R059 Cough, unspecified: Secondary | ICD-10-CM | POA: Diagnosis not present

## 2020-10-27 ENCOUNTER — Other Ambulatory Visit: Payer: Self-pay

## 2020-10-27 ENCOUNTER — Ambulatory Visit: Payer: Medicare HMO | Admitting: Podiatry

## 2020-10-27 DIAGNOSIS — L600 Ingrowing nail: Secondary | ICD-10-CM | POA: Diagnosis not present

## 2020-10-29 DIAGNOSIS — R69 Illness, unspecified: Secondary | ICD-10-CM | POA: Diagnosis not present

## 2020-10-29 DIAGNOSIS — F419 Anxiety disorder, unspecified: Secondary | ICD-10-CM | POA: Diagnosis not present

## 2020-10-29 DIAGNOSIS — Z803 Family history of malignant neoplasm of breast: Secondary | ICD-10-CM | POA: Diagnosis not present

## 2020-10-29 DIAGNOSIS — Z6831 Body mass index (BMI) 31.0-31.9, adult: Secondary | ICD-10-CM | POA: Diagnosis not present

## 2020-10-29 DIAGNOSIS — K219 Gastro-esophageal reflux disease without esophagitis: Secondary | ICD-10-CM | POA: Diagnosis not present

## 2020-10-29 DIAGNOSIS — G4700 Insomnia, unspecified: Secondary | ICD-10-CM | POA: Diagnosis not present

## 2020-10-29 DIAGNOSIS — E669 Obesity, unspecified: Secondary | ICD-10-CM | POA: Diagnosis not present

## 2020-10-29 DIAGNOSIS — E785 Hyperlipidemia, unspecified: Secondary | ICD-10-CM | POA: Diagnosis not present

## 2020-10-29 DIAGNOSIS — R030 Elevated blood-pressure reading, without diagnosis of hypertension: Secondary | ICD-10-CM | POA: Diagnosis not present

## 2020-11-02 ENCOUNTER — Encounter: Payer: Self-pay | Admitting: Podiatry

## 2020-11-02 NOTE — Progress Notes (Signed)
Subjective:  Patient ID: Paula Poole, female    DOB: 1950-06-12,  MRN: [id number]  Chief Complaint  Patient presents with   Ingrown Toenail    Bilateral hallux ingrown     70 y.o. female presents with the above complaint. Patient presents with thickened and dystrophic bilateral hallux ingrown.  Patient states that they are getting thicker over the time and is ingrowing on the sides.  She states that she has tried many over-the-counter application none of which has helped.  She would like to discuss removal of the nail.  She has not seen anyone else prior to seeing me.  She denies any other acute complaints.  They are painful to touch pain scale is 7 out of 10 especially with ambulation.  Dull achy in nature    Review of Systems: Negative except as noted in the HPI. Denies N/V/F/Ch.  Past Medical History:  Diagnosis Date   Anemia    as a child   Arthritis    Depression    due to death of husband a year ago   GERD (gastroesophageal reflux disease)    not for years   High cholesterol    Pneumonia     Current Outpatient Medications:    APPLE CIDER VINEGAR PO, Take by mouth., Disp: , Rfl:    ASPIRIN 81 PO, Take by mouth., Disp: , Rfl:    ELDERBERRY PO, Take by mouth., Disp: , Rfl:    gemfibrozil (LOPID) 600 MG tablet, Take 600 mg by mouth at bedtime., Disp: , Rfl: 3   hydrOXYzine (VISTARIL) 25 MG capsule, Take 50 mg by mouth at bedtime., Disp: , Rfl: 1   Multiple Vitamin (MULTIVITAMIN WITH MINERALS) TABS tablet, Take 1 tablet by mouth daily. Multivitamin for Women, Disp: , Rfl:    naproxen sodium (ALEVE) 220 MG tablet, Take 220 mg by mouth 2 (two) times daily as needed (for pain.)., Disp: , Rfl:    traZODone (DESYREL) 50 MG tablet, Take 50 mg by mouth at bedtime. , Disp: , Rfl: 1  Social History   Tobacco Use  Smoking Status Former   Pack years: 0.00   Types: Cigarettes   Quit date: 09/25/2016   Years since quitting: 4.1  Smokeless Tobacco Never    Allergies  Allergen  Reactions   Nickel Hives    Cheap jewelry makes her itchy   Tour manager Hives   Latex Itching   Objective:  There were no vitals filed for this visit. There is no height or weight on file to calculate BMI. Constitutional Well developed. Well nourished.  Vascular Dorsalis pedis pulses palpable bilaterally. Posterior tibial pulses palpable bilaterally. Capillary refill normal to all digits.  No cyanosis or clubbing noted. Pedal hair growth normal.  Neurologic Normal speech. Oriented to person, place, and time. Epicritic sensation to light touch grossly present bilaterally.  Dermatologic Pain on palpation of the entire/total nail on 1st digit of the bilaterally ingrown noted to both medial lateral border both sides No other open wounds. No skin lesions.  Orthopedic: Normal joint ROM without pain or crepitus bilaterally. No visible deformities. No bony tenderness.   Radiographs: None Assessment:   1. Ingrown toenail of right foot   2. Ingrown left big toenail    Plan:  Patient was evaluated and treated and all questions answered.  Nail contusion/dystrophy hallux with underlying ingrown, bilaterally -Patient elects to proceed with minor surgery to remove entire toenail today. Consent reviewed and signed by patient. -Entire/total nail excised. See procedure note. -  Educated on post-procedure care including soaking. Written instructions provided and reviewed. -Patient to follow up in 2 weeks for nail check.  Procedure: Excision of entire/total nail  Location: Bilateral 1st toe digit Anesthesia: Lidocaine 1% plain; 1.5 mL and Marcaine 0.5% plain; 1.5 mL, digital block. Skin Prep: Betadine. Dressing: Silvadene; telfa; dry, sterile, compression dressing. Technique: Following skin prep, the toe was exsanguinated and a tourniquet was secured at the base of the toe. The affected nail border was freed and excised.  Phenol matrixectomy ectomy was performed in standard technique to  both medial and lateral border bilaterally.  The tourniquet was then removed and sterile dressing applied. Disposition: Patient tolerated procedure well. Patient to return in 2 weeks for follow-up.   No follow-ups on file.

## 2020-11-11 DIAGNOSIS — E785 Hyperlipidemia, unspecified: Secondary | ICD-10-CM | POA: Diagnosis not present

## 2020-11-11 DIAGNOSIS — D649 Anemia, unspecified: Secondary | ICD-10-CM | POA: Diagnosis not present

## 2020-11-16 DIAGNOSIS — E785 Hyperlipidemia, unspecified: Secondary | ICD-10-CM | POA: Diagnosis not present

## 2020-11-16 DIAGNOSIS — Z0000 Encounter for general adult medical examination without abnormal findings: Secondary | ICD-10-CM | POA: Diagnosis not present

## 2020-11-16 DIAGNOSIS — I700 Atherosclerosis of aorta: Secondary | ICD-10-CM | POA: Diagnosis not present

## 2020-11-16 DIAGNOSIS — Z23 Encounter for immunization: Secondary | ICD-10-CM | POA: Diagnosis not present

## 2020-11-16 DIAGNOSIS — K760 Fatty (change of) liver, not elsewhere classified: Secondary | ICD-10-CM | POA: Diagnosis not present

## 2020-11-16 DIAGNOSIS — I2584 Coronary atherosclerosis due to calcified coronary lesion: Secondary | ICD-10-CM | POA: Diagnosis not present

## 2020-11-16 DIAGNOSIS — D696 Thrombocytopenia, unspecified: Secondary | ICD-10-CM | POA: Diagnosis not present

## 2020-11-16 DIAGNOSIS — Z1331 Encounter for screening for depression: Secondary | ICD-10-CM | POA: Diagnosis not present

## 2020-11-16 DIAGNOSIS — E669 Obesity, unspecified: Secondary | ICD-10-CM | POA: Diagnosis not present

## 2020-11-16 DIAGNOSIS — Z1389 Encounter for screening for other disorder: Secondary | ICD-10-CM | POA: Diagnosis not present

## 2020-11-16 DIAGNOSIS — I2510 Atherosclerotic heart disease of native coronary artery without angina pectoris: Secondary | ICD-10-CM | POA: Diagnosis not present

## 2020-11-16 DIAGNOSIS — R7301 Impaired fasting glucose: Secondary | ICD-10-CM | POA: Diagnosis not present

## 2020-11-16 DIAGNOSIS — R82998 Other abnormal findings in urine: Secondary | ICD-10-CM | POA: Diagnosis not present

## 2020-12-03 ENCOUNTER — Other Ambulatory Visit: Payer: Self-pay | Admitting: Endocrinology

## 2020-12-03 DIAGNOSIS — Z1231 Encounter for screening mammogram for malignant neoplasm of breast: Secondary | ICD-10-CM

## 2020-12-13 DIAGNOSIS — R5383 Other fatigue: Secondary | ICD-10-CM | POA: Diagnosis not present

## 2020-12-13 DIAGNOSIS — R0981 Nasal congestion: Secondary | ICD-10-CM | POA: Diagnosis not present

## 2020-12-13 DIAGNOSIS — R059 Cough, unspecified: Secondary | ICD-10-CM | POA: Diagnosis not present

## 2020-12-13 DIAGNOSIS — U071 COVID-19: Secondary | ICD-10-CM | POA: Diagnosis not present

## 2020-12-13 DIAGNOSIS — R509 Fever, unspecified: Secondary | ICD-10-CM | POA: Diagnosis not present

## 2020-12-13 DIAGNOSIS — Z1152 Encounter for screening for COVID-19: Secondary | ICD-10-CM | POA: Diagnosis not present

## 2021-01-04 DIAGNOSIS — Z471 Aftercare following joint replacement surgery: Secondary | ICD-10-CM | POA: Diagnosis not present

## 2021-01-04 DIAGNOSIS — Z96651 Presence of right artificial knee joint: Secondary | ICD-10-CM | POA: Diagnosis not present

## 2021-01-04 DIAGNOSIS — Z96652 Presence of left artificial knee joint: Secondary | ICD-10-CM | POA: Diagnosis not present

## 2021-01-12 DIAGNOSIS — F3341 Major depressive disorder, recurrent, in partial remission: Secondary | ICD-10-CM | POA: Diagnosis not present

## 2021-01-12 DIAGNOSIS — R69 Illness, unspecified: Secondary | ICD-10-CM | POA: Diagnosis not present

## 2021-01-24 ENCOUNTER — Other Ambulatory Visit: Payer: Self-pay

## 2021-01-24 ENCOUNTER — Ambulatory Visit
Admission: RE | Admit: 2021-01-24 | Discharge: 2021-01-24 | Disposition: A | Discharge status: Home / Self Care | Payer: Medicare HMO | Source: Ambulatory Visit | Attending: Endocrinology | Admitting: Endocrinology

## 2021-01-24 DIAGNOSIS — Z1231 Encounter for screening mammogram for malignant neoplasm of breast: Secondary | ICD-10-CM | POA: Diagnosis not present

## 2021-02-07 DIAGNOSIS — R69 Illness, unspecified: Secondary | ICD-10-CM | POA: Diagnosis not present

## 2021-02-07 DIAGNOSIS — F3341 Major depressive disorder, recurrent, in partial remission: Secondary | ICD-10-CM | POA: Diagnosis not present

## 2021-04-14 DIAGNOSIS — M25562 Pain in left knee: Secondary | ICD-10-CM | POA: Diagnosis not present

## 2021-05-04 DIAGNOSIS — H5203 Hypermetropia, bilateral: Secondary | ICD-10-CM | POA: Diagnosis not present

## 2021-05-04 DIAGNOSIS — Z0100 Encounter for examination of eyes and vision without abnormal findings: Secondary | ICD-10-CM | POA: Diagnosis not present

## 2021-05-19 DIAGNOSIS — K760 Fatty (change of) liver, not elsewhere classified: Secondary | ICD-10-CM | POA: Diagnosis not present

## 2021-05-19 DIAGNOSIS — R7301 Impaired fasting glucose: Secondary | ICD-10-CM | POA: Diagnosis not present

## 2021-05-19 DIAGNOSIS — N3281 Overactive bladder: Secondary | ICD-10-CM | POA: Diagnosis not present

## 2021-05-19 DIAGNOSIS — E669 Obesity, unspecified: Secondary | ICD-10-CM | POA: Diagnosis not present

## 2021-05-19 DIAGNOSIS — D696 Thrombocytopenia, unspecified: Secondary | ICD-10-CM | POA: Diagnosis not present

## 2021-05-19 DIAGNOSIS — E785 Hyperlipidemia, unspecified: Secondary | ICD-10-CM | POA: Diagnosis not present

## 2021-05-19 DIAGNOSIS — K219 Gastro-esophageal reflux disease without esophagitis: Secondary | ICD-10-CM | POA: Diagnosis not present

## 2021-05-19 DIAGNOSIS — I700 Atherosclerosis of aorta: Secondary | ICD-10-CM | POA: Diagnosis not present

## 2021-05-19 DIAGNOSIS — D649 Anemia, unspecified: Secondary | ICD-10-CM | POA: Diagnosis not present

## 2021-05-19 DIAGNOSIS — I2584 Coronary atherosclerosis due to calcified coronary lesion: Secondary | ICD-10-CM | POA: Diagnosis not present

## 2021-05-27 DIAGNOSIS — R69 Illness, unspecified: Secondary | ICD-10-CM | POA: Diagnosis not present

## 2021-05-27 DIAGNOSIS — F3341 Major depressive disorder, recurrent, in partial remission: Secondary | ICD-10-CM | POA: Diagnosis not present

## 2021-06-03 DIAGNOSIS — R69 Illness, unspecified: Secondary | ICD-10-CM | POA: Diagnosis not present

## 2021-06-03 DIAGNOSIS — F3341 Major depressive disorder, recurrent, in partial remission: Secondary | ICD-10-CM | POA: Diagnosis not present

## 2021-06-24 DIAGNOSIS — R69 Illness, unspecified: Secondary | ICD-10-CM | POA: Diagnosis not present

## 2021-06-24 DIAGNOSIS — F3341 Major depressive disorder, recurrent, in partial remission: Secondary | ICD-10-CM | POA: Diagnosis not present

## 2021-07-01 DIAGNOSIS — R69 Illness, unspecified: Secondary | ICD-10-CM | POA: Diagnosis not present

## 2021-07-01 DIAGNOSIS — F3341 Major depressive disorder, recurrent, in partial remission: Secondary | ICD-10-CM | POA: Diagnosis not present

## 2021-07-08 DIAGNOSIS — R69 Illness, unspecified: Secondary | ICD-10-CM | POA: Diagnosis not present

## 2021-07-08 DIAGNOSIS — F3341 Major depressive disorder, recurrent, in partial remission: Secondary | ICD-10-CM | POA: Diagnosis not present

## 2021-07-29 DIAGNOSIS — R69 Illness, unspecified: Secondary | ICD-10-CM | POA: Diagnosis not present

## 2021-07-29 DIAGNOSIS — F3341 Major depressive disorder, recurrent, in partial remission: Secondary | ICD-10-CM | POA: Diagnosis not present

## 2021-08-05 DIAGNOSIS — F3341 Major depressive disorder, recurrent, in partial remission: Secondary | ICD-10-CM | POA: Diagnosis not present

## 2021-08-05 DIAGNOSIS — R69 Illness, unspecified: Secondary | ICD-10-CM | POA: Diagnosis not present

## 2021-08-19 DIAGNOSIS — R69 Illness, unspecified: Secondary | ICD-10-CM | POA: Diagnosis not present

## 2021-08-19 DIAGNOSIS — F3341 Major depressive disorder, recurrent, in partial remission: Secondary | ICD-10-CM | POA: Diagnosis not present

## 2021-08-26 DIAGNOSIS — F3341 Major depressive disorder, recurrent, in partial remission: Secondary | ICD-10-CM | POA: Diagnosis not present

## 2021-08-26 DIAGNOSIS — R69 Illness, unspecified: Secondary | ICD-10-CM | POA: Diagnosis not present

## 2021-08-30 DIAGNOSIS — M1712 Unilateral primary osteoarthritis, left knee: Secondary | ICD-10-CM | POA: Diagnosis not present

## 2021-08-30 DIAGNOSIS — Z96652 Presence of left artificial knee joint: Secondary | ICD-10-CM | POA: Diagnosis not present

## 2021-09-02 DIAGNOSIS — R69 Illness, unspecified: Secondary | ICD-10-CM | POA: Diagnosis not present

## 2021-09-02 DIAGNOSIS — F3341 Major depressive disorder, recurrent, in partial remission: Secondary | ICD-10-CM | POA: Diagnosis not present

## 2021-09-09 DIAGNOSIS — R69 Illness, unspecified: Secondary | ICD-10-CM | POA: Diagnosis not present

## 2021-09-09 DIAGNOSIS — F3341 Major depressive disorder, recurrent, in partial remission: Secondary | ICD-10-CM | POA: Diagnosis not present

## 2021-09-16 DIAGNOSIS — R69 Illness, unspecified: Secondary | ICD-10-CM | POA: Diagnosis not present

## 2021-09-16 DIAGNOSIS — F3341 Major depressive disorder, recurrent, in partial remission: Secondary | ICD-10-CM | POA: Diagnosis not present

## 2021-09-23 DIAGNOSIS — F3341 Major depressive disorder, recurrent, in partial remission: Secondary | ICD-10-CM | POA: Diagnosis not present

## 2021-09-23 DIAGNOSIS — R69 Illness, unspecified: Secondary | ICD-10-CM | POA: Diagnosis not present

## 2021-09-28 DIAGNOSIS — L03312 Cellulitis of back [any part except buttock]: Secondary | ICD-10-CM | POA: Diagnosis not present

## 2021-09-30 DIAGNOSIS — R69 Illness, unspecified: Secondary | ICD-10-CM | POA: Diagnosis not present

## 2021-09-30 DIAGNOSIS — F3341 Major depressive disorder, recurrent, in partial remission: Secondary | ICD-10-CM | POA: Diagnosis not present

## 2021-10-14 DIAGNOSIS — F3341 Major depressive disorder, recurrent, in partial remission: Secondary | ICD-10-CM | POA: Diagnosis not present

## 2021-10-14 DIAGNOSIS — R69 Illness, unspecified: Secondary | ICD-10-CM | POA: Diagnosis not present

## 2021-10-21 DIAGNOSIS — F3341 Major depressive disorder, recurrent, in partial remission: Secondary | ICD-10-CM | POA: Diagnosis not present

## 2021-10-21 DIAGNOSIS — R69 Illness, unspecified: Secondary | ICD-10-CM | POA: Diagnosis not present

## 2021-10-28 DIAGNOSIS — R69 Illness, unspecified: Secondary | ICD-10-CM | POA: Diagnosis not present

## 2021-10-28 DIAGNOSIS — F3341 Major depressive disorder, recurrent, in partial remission: Secondary | ICD-10-CM | POA: Diagnosis not present

## 2021-11-04 DIAGNOSIS — F3341 Major depressive disorder, recurrent, in partial remission: Secondary | ICD-10-CM | POA: Diagnosis not present

## 2021-11-04 DIAGNOSIS — R69 Illness, unspecified: Secondary | ICD-10-CM | POA: Diagnosis not present

## 2021-11-11 DIAGNOSIS — R69 Illness, unspecified: Secondary | ICD-10-CM | POA: Diagnosis not present

## 2021-11-11 DIAGNOSIS — F3341 Major depressive disorder, recurrent, in partial remission: Secondary | ICD-10-CM | POA: Diagnosis not present

## 2021-11-14 DIAGNOSIS — E785 Hyperlipidemia, unspecified: Secondary | ICD-10-CM | POA: Diagnosis not present

## 2021-11-14 DIAGNOSIS — R7301 Impaired fasting glucose: Secondary | ICD-10-CM | POA: Diagnosis not present

## 2021-11-14 DIAGNOSIS — R7989 Other specified abnormal findings of blood chemistry: Secondary | ICD-10-CM | POA: Diagnosis not present

## 2021-11-18 DIAGNOSIS — Z0000 Encounter for general adult medical examination without abnormal findings: Secondary | ICD-10-CM | POA: Diagnosis not present

## 2021-11-18 DIAGNOSIS — R69 Illness, unspecified: Secondary | ICD-10-CM | POA: Diagnosis not present

## 2021-11-18 DIAGNOSIS — F3341 Major depressive disorder, recurrent, in partial remission: Secondary | ICD-10-CM | POA: Diagnosis not present

## 2021-11-22 DIAGNOSIS — I2584 Coronary atherosclerosis due to calcified coronary lesion: Secondary | ICD-10-CM | POA: Diagnosis not present

## 2021-11-22 DIAGNOSIS — K219 Gastro-esophageal reflux disease without esophagitis: Secondary | ICD-10-CM | POA: Diagnosis not present

## 2021-11-22 DIAGNOSIS — R82998 Other abnormal findings in urine: Secondary | ICD-10-CM | POA: Diagnosis not present

## 2021-11-22 DIAGNOSIS — Z0000 Encounter for general adult medical examination without abnormal findings: Secondary | ICD-10-CM | POA: Diagnosis not present

## 2021-11-22 DIAGNOSIS — I700 Atherosclerosis of aorta: Secondary | ICD-10-CM | POA: Diagnosis not present

## 2021-11-22 DIAGNOSIS — K760 Fatty (change of) liver, not elsewhere classified: Secondary | ICD-10-CM | POA: Diagnosis not present

## 2021-11-22 DIAGNOSIS — E669 Obesity, unspecified: Secondary | ICD-10-CM | POA: Diagnosis not present

## 2021-11-22 DIAGNOSIS — R7301 Impaired fasting glucose: Secondary | ICD-10-CM | POA: Diagnosis not present

## 2021-11-22 DIAGNOSIS — R161 Splenomegaly, not elsewhere classified: Secondary | ICD-10-CM | POA: Diagnosis not present

## 2021-11-22 DIAGNOSIS — Z1331 Encounter for screening for depression: Secondary | ICD-10-CM | POA: Diagnosis not present

## 2021-11-22 DIAGNOSIS — D696 Thrombocytopenia, unspecified: Secondary | ICD-10-CM | POA: Diagnosis not present

## 2021-11-22 DIAGNOSIS — E785 Hyperlipidemia, unspecified: Secondary | ICD-10-CM | POA: Diagnosis not present

## 2021-11-22 DIAGNOSIS — N3281 Overactive bladder: Secondary | ICD-10-CM | POA: Diagnosis not present

## 2021-11-25 DIAGNOSIS — F3341 Major depressive disorder, recurrent, in partial remission: Secondary | ICD-10-CM | POA: Diagnosis not present

## 2021-11-25 DIAGNOSIS — R69 Illness, unspecified: Secondary | ICD-10-CM | POA: Diagnosis not present

## 2021-12-13 ENCOUNTER — Other Ambulatory Visit: Payer: Self-pay | Admitting: Endocrinology

## 2021-12-13 DIAGNOSIS — Z1231 Encounter for screening mammogram for malignant neoplasm of breast: Secondary | ICD-10-CM

## 2021-12-16 DIAGNOSIS — R69 Illness, unspecified: Secondary | ICD-10-CM | POA: Diagnosis not present

## 2021-12-16 DIAGNOSIS — F3341 Major depressive disorder, recurrent, in partial remission: Secondary | ICD-10-CM | POA: Diagnosis not present

## 2022-01-04 DIAGNOSIS — R5383 Other fatigue: Secondary | ICD-10-CM | POA: Diagnosis not present

## 2022-01-04 DIAGNOSIS — R638 Other symptoms and signs concerning food and fluid intake: Secondary | ICD-10-CM | POA: Diagnosis not present

## 2022-01-04 DIAGNOSIS — Z1152 Encounter for screening for COVID-19: Secondary | ICD-10-CM | POA: Diagnosis not present

## 2022-01-04 DIAGNOSIS — R0981 Nasal congestion: Secondary | ICD-10-CM | POA: Diagnosis not present

## 2022-01-04 DIAGNOSIS — J0100 Acute maxillary sinusitis, unspecified: Secondary | ICD-10-CM | POA: Diagnosis not present

## 2022-01-13 DIAGNOSIS — F3341 Major depressive disorder, recurrent, in partial remission: Secondary | ICD-10-CM | POA: Diagnosis not present

## 2022-01-13 DIAGNOSIS — R69 Illness, unspecified: Secondary | ICD-10-CM | POA: Diagnosis not present

## 2022-01-18 IMAGING — MG MM DIGITAL SCREENING BILAT W/ TOMO AND CAD
8 series · 8 of 24 positions shown · non-contrast
Comparison: Previous exam(s).

CLINICAL DATA: Screening.

EXAM:
DIGITAL SCREENING BILATERAL MAMMOGRAM WITH TOMOSYNTHESIS AND CAD
TECHNIQUE: Bilateral screening digital craniocaudal and mediolateral oblique
mammograms were obtained. Bilateral screening digital breast
tomosynthesis was performed. The images were evaluated with
computer-aided detection.

[L MLO synth-2D]
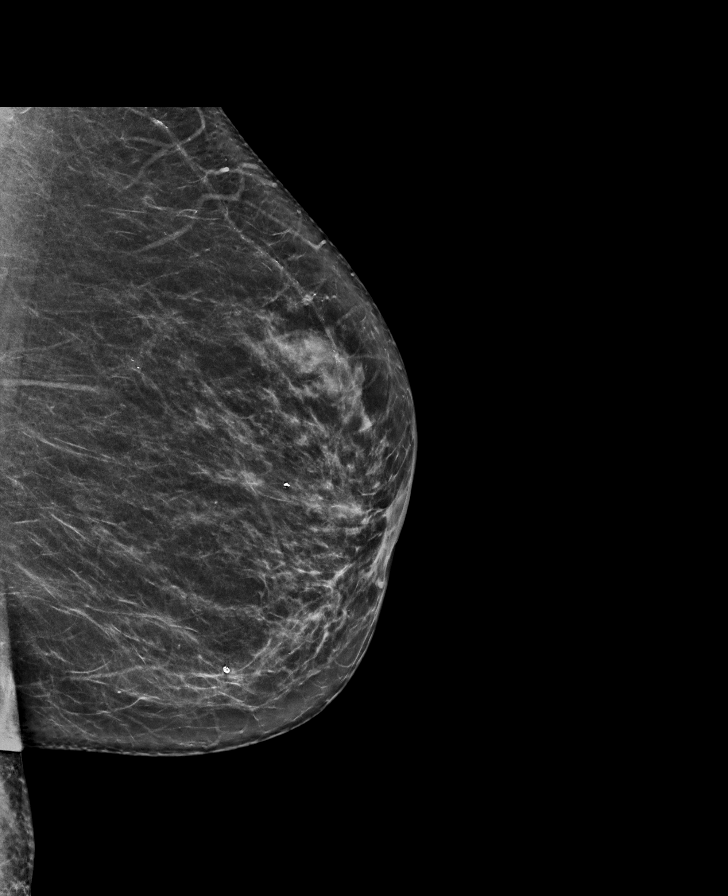

[L CC synth-2D]
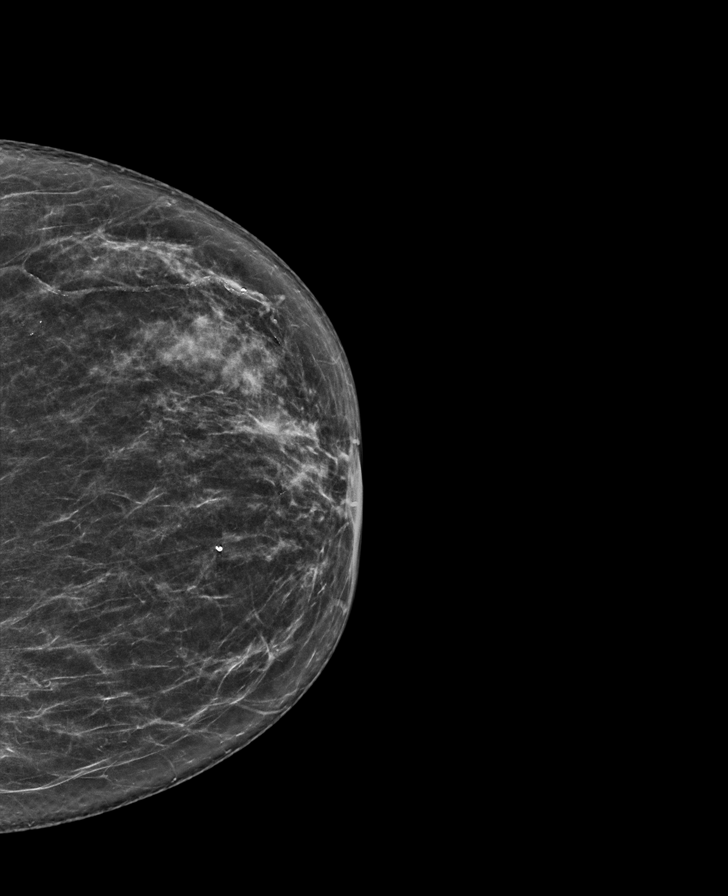

[R CC synth-2D]
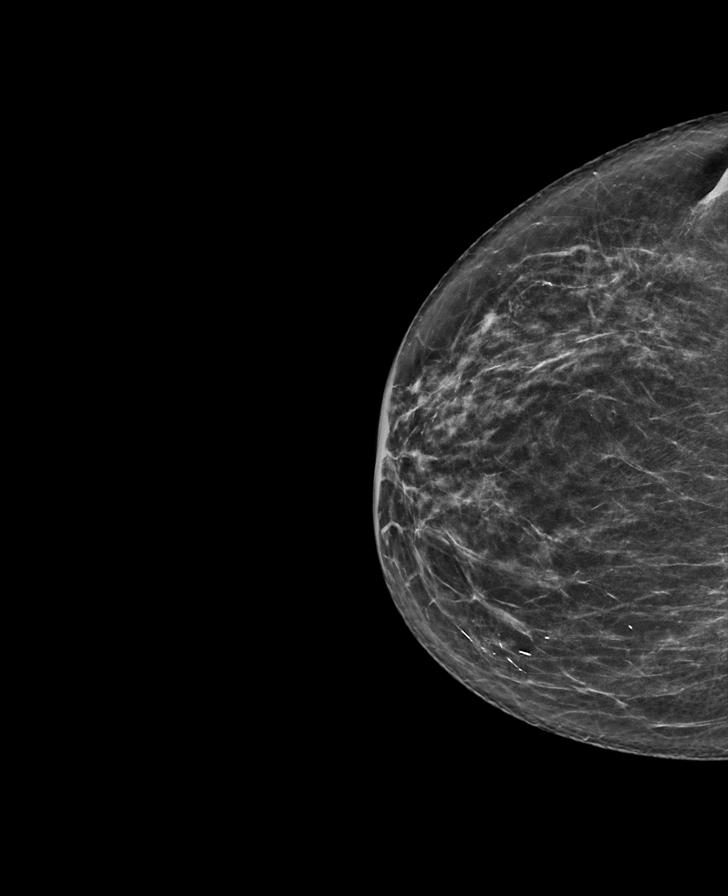

[R MLO synth-2D]
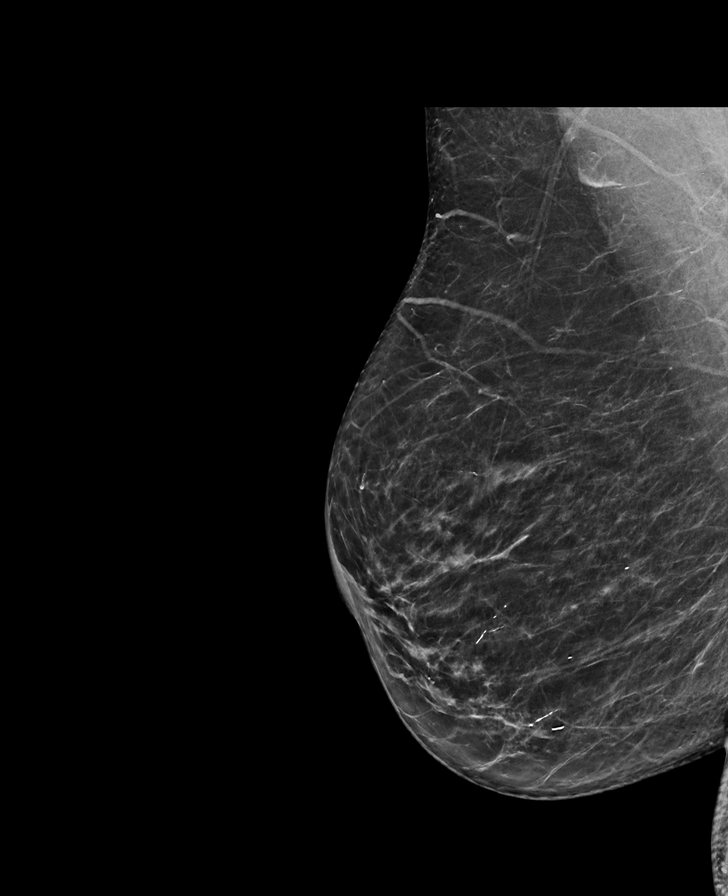

[R CC tomo · tomo slice 35/70.0]
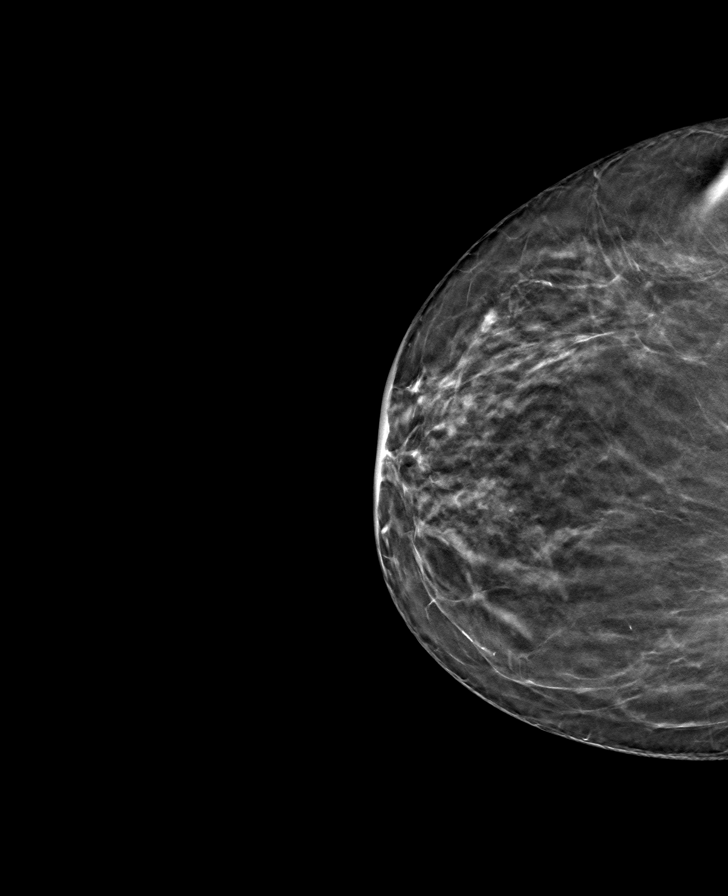

[L MLO tomo · tomo slice 37/73.0]
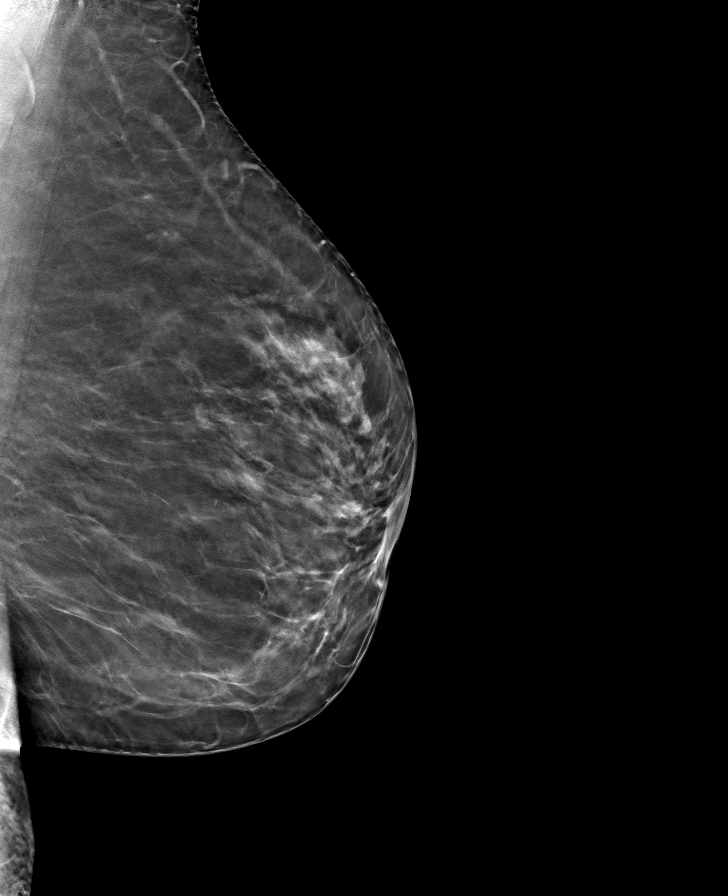

[L CC tomo · tomo slice 33/65.0]
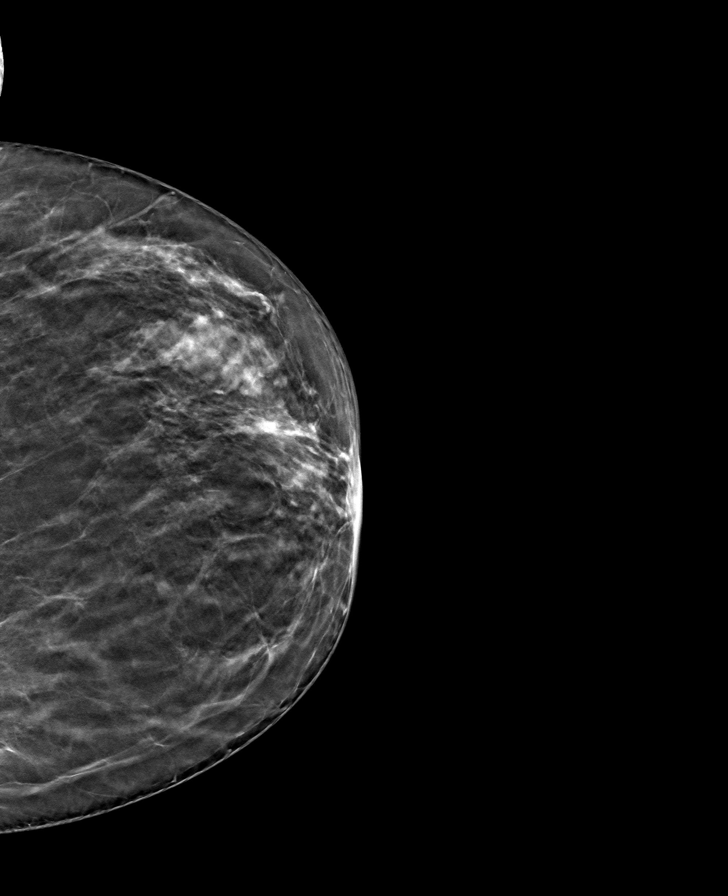

[R MLO tomo · tomo slice 38/75.0]
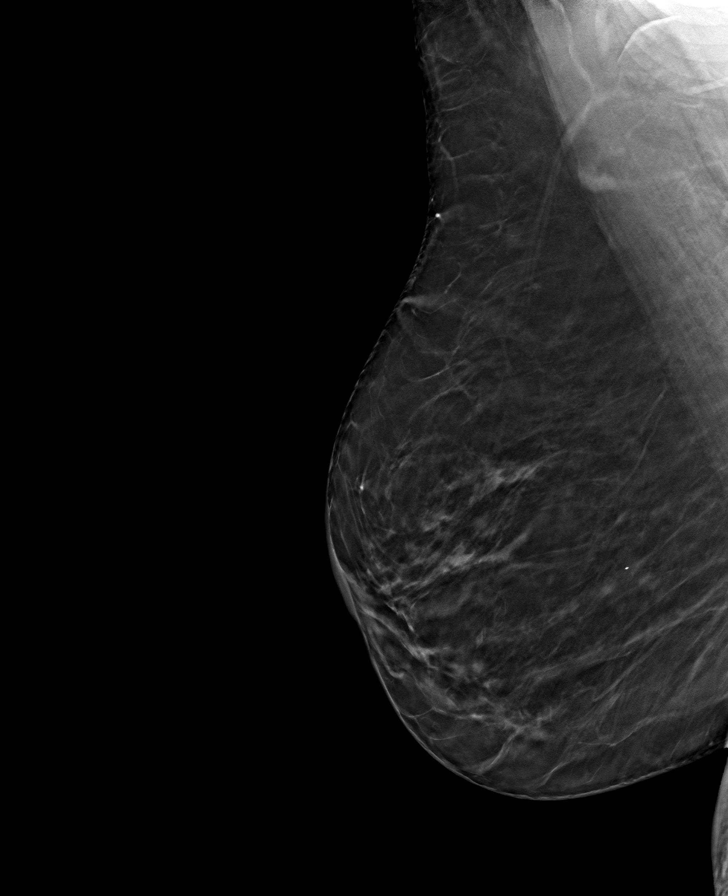

[8 of 24 positions shown; findings below may reference images not displayed]

ACR Breast Density Category b: There are scattered areas of
fibroglandular density.
FINDINGS: There are no findings suspicious for malignancy.
IMPRESSION: No mammographic evidence of malignancy. A result letter of this
screening mammogram will be mailed directly to the patient.

RECOMMENDATION:
Screening mammogram in one year. (Code:51-O-LD2)

BI-RADS CATEGORY  1: Negative.

## 2022-02-03 DIAGNOSIS — R69 Illness, unspecified: Secondary | ICD-10-CM | POA: Diagnosis not present

## 2022-02-03 DIAGNOSIS — F3341 Major depressive disorder, recurrent, in partial remission: Secondary | ICD-10-CM | POA: Diagnosis not present

## 2022-02-08 ENCOUNTER — Ambulatory Visit
Admission: RE | Admit: 2022-02-08 | Discharge: 2022-02-08 | Disposition: A | Discharge status: Home / Self Care | Payer: Medicare HMO | Source: Ambulatory Visit | Attending: Endocrinology | Admitting: Endocrinology

## 2022-02-08 DIAGNOSIS — Z1231 Encounter for screening mammogram for malignant neoplasm of breast: Secondary | ICD-10-CM | POA: Diagnosis not present

## 2022-02-09 ENCOUNTER — Other Ambulatory Visit: Payer: Self-pay | Admitting: Endocrinology

## 2022-02-09 DIAGNOSIS — R928 Other abnormal and inconclusive findings on diagnostic imaging of breast: Secondary | ICD-10-CM

## 2022-02-10 DIAGNOSIS — R69 Illness, unspecified: Secondary | ICD-10-CM | POA: Diagnosis not present

## 2022-02-10 DIAGNOSIS — F3341 Major depressive disorder, recurrent, in partial remission: Secondary | ICD-10-CM | POA: Diagnosis not present

## 2022-02-20 ENCOUNTER — Ambulatory Visit
Admission: RE | Admit: 2022-02-20 | Discharge: 2022-02-20 | Disposition: A | Discharge status: Home / Self Care | Payer: Medicare HMO | Source: Ambulatory Visit | Attending: Endocrinology | Admitting: Endocrinology

## 2022-02-20 ENCOUNTER — Other Ambulatory Visit: Payer: Self-pay | Admitting: Endocrinology

## 2022-02-20 DIAGNOSIS — R928 Other abnormal and inconclusive findings on diagnostic imaging of breast: Secondary | ICD-10-CM | POA: Diagnosis not present

## 2022-02-20 DIAGNOSIS — R921 Mammographic calcification found on diagnostic imaging of breast: Secondary | ICD-10-CM | POA: Diagnosis not present

## 2022-02-20 DIAGNOSIS — N6320 Unspecified lump in the left breast, unspecified quadrant: Secondary | ICD-10-CM

## 2022-02-24 DIAGNOSIS — R69 Illness, unspecified: Secondary | ICD-10-CM | POA: Diagnosis not present

## 2022-02-24 DIAGNOSIS — F3341 Major depressive disorder, recurrent, in partial remission: Secondary | ICD-10-CM | POA: Diagnosis not present

## 2022-03-03 DIAGNOSIS — F3341 Major depressive disorder, recurrent, in partial remission: Secondary | ICD-10-CM | POA: Diagnosis not present

## 2022-03-03 DIAGNOSIS — R69 Illness, unspecified: Secondary | ICD-10-CM | POA: Diagnosis not present

## 2022-03-17 ENCOUNTER — Ambulatory Visit
Admission: RE | Admit: 2022-03-17 | Discharge: 2022-03-17 | Disposition: A | Discharge status: Home / Self Care | Payer: Medicare HMO | Source: Ambulatory Visit | Attending: Endocrinology | Admitting: Endocrinology

## 2022-03-17 DIAGNOSIS — R921 Mammographic calcification found on diagnostic imaging of breast: Secondary | ICD-10-CM | POA: Diagnosis not present

## 2022-03-17 DIAGNOSIS — N6489 Other specified disorders of breast: Secondary | ICD-10-CM | POA: Diagnosis not present

## 2022-03-17 HISTORY — PX: BREAST BIOPSY: SHX20

## 2022-04-13 DIAGNOSIS — H52223 Regular astigmatism, bilateral: Secondary | ICD-10-CM | POA: Diagnosis not present

## 2022-04-14 DIAGNOSIS — R69 Illness, unspecified: Secondary | ICD-10-CM | POA: Diagnosis not present

## 2022-04-14 DIAGNOSIS — F3341 Major depressive disorder, recurrent, in partial remission: Secondary | ICD-10-CM | POA: Diagnosis not present

## 2022-04-19 DIAGNOSIS — M25562 Pain in left knee: Secondary | ICD-10-CM | POA: Diagnosis not present

## 2022-04-21 DIAGNOSIS — F3341 Major depressive disorder, recurrent, in partial remission: Secondary | ICD-10-CM | POA: Diagnosis not present

## 2022-04-21 DIAGNOSIS — R69 Illness, unspecified: Secondary | ICD-10-CM | POA: Diagnosis not present

## 2022-05-10 DIAGNOSIS — Z96652 Presence of left artificial knee joint: Secondary | ICD-10-CM | POA: Diagnosis not present

## 2022-05-10 DIAGNOSIS — M6281 Muscle weakness (generalized): Secondary | ICD-10-CM | POA: Diagnosis not present

## 2022-05-10 DIAGNOSIS — M7632 Iliotibial band syndrome, left leg: Secondary | ICD-10-CM | POA: Diagnosis not present

## 2022-05-15 DIAGNOSIS — M6281 Muscle weakness (generalized): Secondary | ICD-10-CM | POA: Diagnosis not present

## 2022-05-15 DIAGNOSIS — Z96652 Presence of left artificial knee joint: Secondary | ICD-10-CM | POA: Diagnosis not present

## 2022-05-15 DIAGNOSIS — M7632 Iliotibial band syndrome, left leg: Secondary | ICD-10-CM | POA: Diagnosis not present

## 2022-05-19 DIAGNOSIS — M6281 Muscle weakness (generalized): Secondary | ICD-10-CM | POA: Diagnosis not present

## 2022-05-19 DIAGNOSIS — M7632 Iliotibial band syndrome, left leg: Secondary | ICD-10-CM | POA: Diagnosis not present

## 2022-05-19 DIAGNOSIS — Z96652 Presence of left artificial knee joint: Secondary | ICD-10-CM | POA: Diagnosis not present

## 2022-05-24 DIAGNOSIS — M6281 Muscle weakness (generalized): Secondary | ICD-10-CM | POA: Diagnosis not present

## 2022-05-24 DIAGNOSIS — Z96652 Presence of left artificial knee joint: Secondary | ICD-10-CM | POA: Diagnosis not present

## 2022-05-24 DIAGNOSIS — M7632 Iliotibial band syndrome, left leg: Secondary | ICD-10-CM | POA: Diagnosis not present

## 2022-05-26 DIAGNOSIS — M6281 Muscle weakness (generalized): Secondary | ICD-10-CM | POA: Diagnosis not present

## 2022-05-26 DIAGNOSIS — F3341 Major depressive disorder, recurrent, in partial remission: Secondary | ICD-10-CM | POA: Diagnosis not present

## 2022-05-26 DIAGNOSIS — M7632 Iliotibial band syndrome, left leg: Secondary | ICD-10-CM | POA: Diagnosis not present

## 2022-05-26 DIAGNOSIS — Z96652 Presence of left artificial knee joint: Secondary | ICD-10-CM | POA: Diagnosis not present

## 2022-05-26 DIAGNOSIS — R69 Illness, unspecified: Secondary | ICD-10-CM | POA: Diagnosis not present

## 2022-06-02 DIAGNOSIS — R69 Illness, unspecified: Secondary | ICD-10-CM | POA: Diagnosis not present

## 2022-06-02 DIAGNOSIS — F3341 Major depressive disorder, recurrent, in partial remission: Secondary | ICD-10-CM | POA: Diagnosis not present

## 2022-06-09 DIAGNOSIS — I700 Atherosclerosis of aorta: Secondary | ICD-10-CM | POA: Diagnosis not present

## 2022-06-09 DIAGNOSIS — K760 Fatty (change of) liver, not elsewhere classified: Secondary | ICD-10-CM | POA: Diagnosis not present

## 2022-06-09 DIAGNOSIS — N3281 Overactive bladder: Secondary | ICD-10-CM | POA: Diagnosis not present

## 2022-06-09 DIAGNOSIS — D696 Thrombocytopenia, unspecified: Secondary | ICD-10-CM | POA: Diagnosis not present

## 2022-06-09 DIAGNOSIS — R161 Splenomegaly, not elsewhere classified: Secondary | ICD-10-CM | POA: Diagnosis not present

## 2022-06-09 DIAGNOSIS — E785 Hyperlipidemia, unspecified: Secondary | ICD-10-CM | POA: Diagnosis not present

## 2022-06-09 DIAGNOSIS — R7301 Impaired fasting glucose: Secondary | ICD-10-CM | POA: Diagnosis not present

## 2022-06-09 DIAGNOSIS — I2584 Coronary atherosclerosis due to calcified coronary lesion: Secondary | ICD-10-CM | POA: Diagnosis not present

## 2022-06-14 DIAGNOSIS — M7632 Iliotibial band syndrome, left leg: Secondary | ICD-10-CM | POA: Diagnosis not present

## 2022-06-14 DIAGNOSIS — M6281 Muscle weakness (generalized): Secondary | ICD-10-CM | POA: Diagnosis not present

## 2022-06-14 DIAGNOSIS — Z96652 Presence of left artificial knee joint: Secondary | ICD-10-CM | POA: Diagnosis not present

## 2022-06-16 DIAGNOSIS — R69 Illness, unspecified: Secondary | ICD-10-CM | POA: Diagnosis not present

## 2022-06-16 DIAGNOSIS — F3341 Major depressive disorder, recurrent, in partial remission: Secondary | ICD-10-CM | POA: Diagnosis not present

## 2022-07-14 DIAGNOSIS — F3341 Major depressive disorder, recurrent, in partial remission: Secondary | ICD-10-CM | POA: Diagnosis not present

## 2022-07-14 DIAGNOSIS — R69 Illness, unspecified: Secondary | ICD-10-CM | POA: Diagnosis not present

## 2022-07-28 DIAGNOSIS — F3341 Major depressive disorder, recurrent, in partial remission: Secondary | ICD-10-CM | POA: Diagnosis not present

## 2022-07-28 DIAGNOSIS — R69 Illness, unspecified: Secondary | ICD-10-CM | POA: Diagnosis not present

## 2022-08-25 DIAGNOSIS — R69 Illness, unspecified: Secondary | ICD-10-CM | POA: Diagnosis not present

## 2022-08-25 DIAGNOSIS — F3341 Major depressive disorder, recurrent, in partial remission: Secondary | ICD-10-CM | POA: Diagnosis not present

## 2022-09-01 DIAGNOSIS — F3341 Major depressive disorder, recurrent, in partial remission: Secondary | ICD-10-CM | POA: Diagnosis not present

## 2022-09-29 DIAGNOSIS — F3341 Major depressive disorder, recurrent, in partial remission: Secondary | ICD-10-CM | POA: Diagnosis not present

## 2022-10-13 DIAGNOSIS — F3341 Major depressive disorder, recurrent, in partial remission: Secondary | ICD-10-CM | POA: Diagnosis not present

## 2022-12-06 DIAGNOSIS — D649 Anemia, unspecified: Secondary | ICD-10-CM | POA: Diagnosis not present

## 2022-12-06 DIAGNOSIS — R7989 Other specified abnormal findings of blood chemistry: Secondary | ICD-10-CM | POA: Diagnosis not present

## 2022-12-06 DIAGNOSIS — R7301 Impaired fasting glucose: Secondary | ICD-10-CM | POA: Diagnosis not present

## 2022-12-06 DIAGNOSIS — E785 Hyperlipidemia, unspecified: Secondary | ICD-10-CM | POA: Diagnosis not present

## 2022-12-06 DIAGNOSIS — Z1212 Encounter for screening for malignant neoplasm of rectum: Secondary | ICD-10-CM | POA: Diagnosis not present

## 2022-12-07 DIAGNOSIS — F3341 Major depressive disorder, recurrent, in partial remission: Secondary | ICD-10-CM | POA: Diagnosis not present

## 2022-12-13 DIAGNOSIS — I700 Atherosclerosis of aorta: Secondary | ICD-10-CM | POA: Diagnosis not present

## 2022-12-13 DIAGNOSIS — E669 Obesity, unspecified: Secondary | ICD-10-CM | POA: Diagnosis not present

## 2022-12-13 DIAGNOSIS — N3281 Overactive bladder: Secondary | ICD-10-CM | POA: Diagnosis not present

## 2022-12-13 DIAGNOSIS — Z1339 Encounter for screening examination for other mental health and behavioral disorders: Secondary | ICD-10-CM | POA: Diagnosis not present

## 2022-12-13 DIAGNOSIS — R7301 Impaired fasting glucose: Secondary | ICD-10-CM | POA: Diagnosis not present

## 2022-12-13 DIAGNOSIS — K219 Gastro-esophageal reflux disease without esophagitis: Secondary | ICD-10-CM | POA: Diagnosis not present

## 2022-12-13 DIAGNOSIS — R82998 Other abnormal findings in urine: Secondary | ICD-10-CM | POA: Diagnosis not present

## 2022-12-13 DIAGNOSIS — Z1331 Encounter for screening for depression: Secondary | ICD-10-CM | POA: Diagnosis not present

## 2022-12-13 DIAGNOSIS — E785 Hyperlipidemia, unspecified: Secondary | ICD-10-CM | POA: Diagnosis not present

## 2022-12-13 DIAGNOSIS — D696 Thrombocytopenia, unspecified: Secondary | ICD-10-CM | POA: Diagnosis not present

## 2022-12-13 DIAGNOSIS — I2584 Coronary atherosclerosis due to calcified coronary lesion: Secondary | ICD-10-CM | POA: Diagnosis not present

## 2022-12-13 DIAGNOSIS — Z800 Family history of malignant neoplasm of digestive organs: Secondary | ICD-10-CM | POA: Diagnosis not present

## 2022-12-13 DIAGNOSIS — Z0000 Encounter for general adult medical examination without abnormal findings: Secondary | ICD-10-CM | POA: Diagnosis not present

## 2022-12-27 ENCOUNTER — Other Ambulatory Visit: Payer: Self-pay | Admitting: Endocrinology

## 2022-12-27 DIAGNOSIS — Z1231 Encounter for screening mammogram for malignant neoplasm of breast: Secondary | ICD-10-CM

## 2023-01-10 ENCOUNTER — Encounter: Payer: Self-pay | Admitting: Internal Medicine

## 2023-01-18 ENCOUNTER — Ambulatory Visit (AMBULATORY_SURGERY_CENTER): Payer: Medicare HMO | Admitting: *Deleted

## 2023-01-18 ENCOUNTER — Encounter: Payer: Self-pay | Admitting: Internal Medicine

## 2023-01-18 VITALS — Ht 65.0 in | Wt 170.0 lb

## 2023-01-18 DIAGNOSIS — Z1211 Encounter for screening for malignant neoplasm of colon: Secondary | ICD-10-CM

## 2023-01-18 DIAGNOSIS — Z800 Family history of malignant neoplasm of digestive organs: Secondary | ICD-10-CM

## 2023-01-18 MED ORDER — NA SULFATE-K SULFATE-MG SULF 17.5-3.13-1.6 GM/177ML PO SOLN: 1.0000 | Freq: Once | ORAL | 0 refills | Status: AC

## 2023-01-18 NOTE — Progress Notes (Signed)

## 2023-01-19 DIAGNOSIS — F3341 Major depressive disorder, recurrent, in partial remission: Secondary | ICD-10-CM | POA: Diagnosis not present

## 2023-01-26 DIAGNOSIS — F3341 Major depressive disorder, recurrent, in partial remission: Secondary | ICD-10-CM | POA: Diagnosis not present

## 2023-01-31 ENCOUNTER — Encounter: Payer: Medicare HMO | Admitting: Internal Medicine

## 2023-02-01 ENCOUNTER — Encounter: Payer: Medicare HMO | Admitting: Internal Medicine

## 2023-02-06 DIAGNOSIS — H18452 Nodular corneal degeneration, left eye: Secondary | ICD-10-CM | POA: Diagnosis not present

## 2023-02-06 DIAGNOSIS — H18451 Nodular corneal degeneration, right eye: Secondary | ICD-10-CM | POA: Diagnosis not present

## 2023-02-09 DIAGNOSIS — F3341 Major depressive disorder, recurrent, in partial remission: Secondary | ICD-10-CM | POA: Diagnosis not present

## 2023-02-19 ENCOUNTER — Ambulatory Visit
Admission: RE | Admit: 2023-02-19 | Discharge: 2023-02-19 | Disposition: A | Discharge status: Home / Self Care | Payer: Medicare HMO | Source: Ambulatory Visit | Attending: Endocrinology | Admitting: Endocrinology

## 2023-02-19 DIAGNOSIS — Z1231 Encounter for screening mammogram for malignant neoplasm of breast: Secondary | ICD-10-CM

## 2023-02-21 ENCOUNTER — Other Ambulatory Visit: Payer: Self-pay | Admitting: Endocrinology

## 2023-02-21 DIAGNOSIS — R928 Other abnormal and inconclusive findings on diagnostic imaging of breast: Secondary | ICD-10-CM

## 2023-03-02 DIAGNOSIS — H18452 Nodular corneal degeneration, left eye: Secondary | ICD-10-CM | POA: Diagnosis not present

## 2023-03-07 ENCOUNTER — Ambulatory Visit
Admission: RE | Admit: 2023-03-07 | Discharge: 2023-03-07 | Disposition: A | Discharge status: Home / Self Care | Payer: Medicare HMO | Source: Ambulatory Visit | Attending: Endocrinology | Admitting: Endocrinology

## 2023-03-07 DIAGNOSIS — R928 Other abnormal and inconclusive findings on diagnostic imaging of breast: Secondary | ICD-10-CM

## 2023-03-16 DIAGNOSIS — F3341 Major depressive disorder, recurrent, in partial remission: Secondary | ICD-10-CM | POA: Diagnosis not present

## 2023-03-21 DIAGNOSIS — Z1211 Encounter for screening for malignant neoplasm of colon: Secondary | ICD-10-CM | POA: Diagnosis not present

## 2023-03-21 DIAGNOSIS — Z1212 Encounter for screening for malignant neoplasm of rectum: Secondary | ICD-10-CM | POA: Diagnosis not present

## 2023-03-23 DIAGNOSIS — F3341 Major depressive disorder, recurrent, in partial remission: Secondary | ICD-10-CM | POA: Diagnosis not present

## 2023-04-13 DIAGNOSIS — F3341 Major depressive disorder, recurrent, in partial remission: Secondary | ICD-10-CM | POA: Diagnosis not present

## 2023-04-20 DIAGNOSIS — F3341 Major depressive disorder, recurrent, in partial remission: Secondary | ICD-10-CM | POA: Diagnosis not present

## 2023-05-11 DIAGNOSIS — Z1152 Encounter for screening for COVID-19: Secondary | ICD-10-CM | POA: Diagnosis not present

## 2023-05-11 DIAGNOSIS — J069 Acute upper respiratory infection, unspecified: Secondary | ICD-10-CM | POA: Diagnosis not present

## 2023-05-11 DIAGNOSIS — R059 Cough, unspecified: Secondary | ICD-10-CM | POA: Diagnosis not present

## 2023-05-11 DIAGNOSIS — R197 Diarrhea, unspecified: Secondary | ICD-10-CM | POA: Diagnosis not present

## 2023-05-11 DIAGNOSIS — R5383 Other fatigue: Secondary | ICD-10-CM | POA: Diagnosis not present

## 2023-05-11 DIAGNOSIS — R0600 Dyspnea, unspecified: Secondary | ICD-10-CM | POA: Diagnosis not present

## 2023-05-25 DIAGNOSIS — F3341 Major depressive disorder, recurrent, in partial remission: Secondary | ICD-10-CM | POA: Diagnosis not present

## 2023-06-01 DIAGNOSIS — F3341 Major depressive disorder, recurrent, in partial remission: Secondary | ICD-10-CM | POA: Diagnosis not present

## 2023-06-08 DIAGNOSIS — F3341 Major depressive disorder, recurrent, in partial remission: Secondary | ICD-10-CM | POA: Diagnosis not present

## 2023-06-15 DIAGNOSIS — F3341 Major depressive disorder, recurrent, in partial remission: Secondary | ICD-10-CM | POA: Diagnosis not present

## 2023-06-22 DIAGNOSIS — F3341 Major depressive disorder, recurrent, in partial remission: Secondary | ICD-10-CM | POA: Diagnosis not present

## 2023-06-29 DIAGNOSIS — F3341 Major depressive disorder, recurrent, in partial remission: Secondary | ICD-10-CM | POA: Diagnosis not present

## 2023-07-09 DIAGNOSIS — R051 Acute cough: Secondary | ICD-10-CM | POA: Diagnosis not present

## 2023-07-09 DIAGNOSIS — J069 Acute upper respiratory infection, unspecified: Secondary | ICD-10-CM | POA: Diagnosis not present

## 2023-07-09 DIAGNOSIS — J40 Bronchitis, not specified as acute or chronic: Secondary | ICD-10-CM | POA: Diagnosis not present

## 2023-07-09 DIAGNOSIS — R0981 Nasal congestion: Secondary | ICD-10-CM | POA: Diagnosis not present

## 2023-07-13 DIAGNOSIS — F3341 Major depressive disorder, recurrent, in partial remission: Secondary | ICD-10-CM | POA: Diagnosis not present

## 2023-07-16 DIAGNOSIS — H18451 Nodular corneal degeneration, right eye: Secondary | ICD-10-CM | POA: Diagnosis not present

## 2023-07-25 DIAGNOSIS — F3341 Major depressive disorder, recurrent, in partial remission: Secondary | ICD-10-CM | POA: Diagnosis not present

## 2023-08-07 ENCOUNTER — Encounter (HOSPITAL_BASED_OUTPATIENT_CLINIC_OR_DEPARTMENT_OTHER): Payer: Self-pay | Admitting: Emergency Medicine

## 2023-08-07 ENCOUNTER — Emergency Department (HOSPITAL_BASED_OUTPATIENT_CLINIC_OR_DEPARTMENT_OTHER)
Admission: EM | Admit: 2023-08-07 | Discharge: 2023-08-07 | Disposition: A | Discharge status: Home / Self Care | Attending: Emergency Medicine | Admitting: Emergency Medicine

## 2023-08-07 ENCOUNTER — Other Ambulatory Visit: Payer: Self-pay

## 2023-08-07 ENCOUNTER — Emergency Department (HOSPITAL_BASED_OUTPATIENT_CLINIC_OR_DEPARTMENT_OTHER)

## 2023-08-07 DIAGNOSIS — R002 Palpitations: Secondary | ICD-10-CM | POA: Diagnosis not present

## 2023-08-07 DIAGNOSIS — Z91040 Latex allergy status: Secondary | ICD-10-CM | POA: Diagnosis not present

## 2023-08-07 DIAGNOSIS — I700 Atherosclerosis of aorta: Secondary | ICD-10-CM | POA: Insufficient documentation

## 2023-08-07 DIAGNOSIS — K449 Diaphragmatic hernia without obstruction or gangrene: Secondary | ICD-10-CM | POA: Insufficient documentation

## 2023-08-07 DIAGNOSIS — K8020 Calculus of gallbladder without cholecystitis without obstruction: Secondary | ICD-10-CM | POA: Insufficient documentation

## 2023-08-07 DIAGNOSIS — F32A Depression, unspecified: Secondary | ICD-10-CM | POA: Diagnosis not present

## 2023-08-07 DIAGNOSIS — Z79899 Other long term (current) drug therapy: Secondary | ICD-10-CM | POA: Diagnosis not present

## 2023-08-07 DIAGNOSIS — R197 Diarrhea, unspecified: Secondary | ICD-10-CM | POA: Insufficient documentation

## 2023-08-07 DIAGNOSIS — F419 Anxiety disorder, unspecified: Secondary | ICD-10-CM | POA: Diagnosis not present

## 2023-08-07 DIAGNOSIS — F410 Panic disorder [episodic paroxysmal anxiety] without agoraphobia: Secondary | ICD-10-CM | POA: Diagnosis not present

## 2023-08-07 DIAGNOSIS — Q631 Lobulated, fused and horseshoe kidney: Secondary | ICD-10-CM | POA: Diagnosis not present

## 2023-08-07 DIAGNOSIS — R45851 Suicidal ideations: Secondary | ICD-10-CM | POA: Diagnosis not present

## 2023-08-07 DIAGNOSIS — R9431 Abnormal electrocardiogram [ECG] [EKG]: Secondary | ICD-10-CM | POA: Diagnosis not present

## 2023-08-07 LAB — COMPREHENSIVE METABOLIC PANEL WITH GFR
ALT: 47 U/L — ABNORMAL HIGH (ref 0–44)
AST: 28 U/L (ref 15–41)
Albumin: 4.8 g/dL (ref 3.5–5.0)
Alkaline Phosphatase: 43 U/L (ref 38–126)
Anion gap: 11 (ref 5–15)
BUN: 14 mg/dL (ref 8–23)
CO2: 26 mmol/L (ref 22–32)
Calcium: 9.9 mg/dL (ref 8.9–10.3)
Chloride: 98 mmol/L (ref 98–111)
Creatinine, Ser: 0.69 mg/dL (ref 0.44–1.00)
GFR, Estimated: >60 mL/min (ref >60)
Glucose, Bld: 96 mg/dL (ref 70–99)
Potassium: 4.1 mmol/L (ref 3.5–5.1)
Sodium: 135 mmol/L (ref 135–145)
Total Bilirubin: 0.6 mg/dL (ref 0.0–1.2)
Total Protein: 7.1 g/dL (ref 6.5–8.1)

## 2023-08-07 LAB — CBC WITH DIFFERENTIAL/PLATELET
Abs Immature Granulocytes: 0.07 10*3/uL (ref 0.00–0.07)
Basophils Absolute: 0.1 10*3/uL (ref 0.0–0.1)
Basophils Relative: 0 %
Eosinophils Absolute: 0 10*3/uL (ref 0.0–0.5)
Eosinophils Relative: 0 %
HCT: 45.1 % (ref 36.0–46.0)
Hemoglobin: 15.5 g/dL — ABNORMAL HIGH (ref 12.0–15.0)
Immature Granulocytes: 1 %
Lymphocytes Relative: 10 %
Lymphs Abs: 1.2 10*3/uL (ref 0.7–4.0)
MCH: 30.4 pg (ref 26.0–34.0)
MCHC: 34.4 g/dL (ref 30.0–36.0)
MCV: 88.4 fL (ref 80.0–100.0)
Monocytes Absolute: 0.7 10*3/uL (ref 0.1–1.0)
Monocytes Relative: 6 %
Neutro Abs: 9.5 10*3/uL — ABNORMAL HIGH (ref 1.7–7.7)
Neutrophils Relative %: 83 %
Platelets: 144 10*3/uL — ABNORMAL LOW (ref 150–400)
RBC: 5.1 MIL/uL (ref 3.87–5.11)
RDW: 12.5 % (ref 11.5–15.5)
WBC: 11.6 10*3/uL — ABNORMAL HIGH (ref 4.0–10.5)
nRBC: 0 % (ref 0.0–0.2)

## 2023-08-07 LAB — RAPID URINE DRUG SCREEN, HOSP PERFORMED
Amphetamines: NOT DETECTED
Barbiturates: NOT DETECTED
Benzodiazepines: NOT DETECTED
Cocaine: NOT DETECTED
Opiates: NOT DETECTED
Tetrahydrocannabinol: NOT DETECTED

## 2023-08-07 LAB — URINALYSIS, ROUTINE W REFLEX MICROSCOPIC
Bilirubin Urine: NEGATIVE
Glucose, UA: NEGATIVE mg/dL
Hgb urine dipstick: NEGATIVE
Ketones, ur: NEGATIVE mg/dL
Leukocytes,Ua: NEGATIVE
Nitrite: NEGATIVE
Protein, ur: NEGATIVE mg/dL
Specific Gravity, Urine: 1.008 (ref 1.005–1.030)
pH: 6 (ref 5.0–8.0)

## 2023-08-07 LAB — SALICYLATE LEVEL: Salicylate Lvl: <7 mg/dL — ABNORMAL LOW (ref 7.0–30.0)

## 2023-08-07 LAB — ETHANOL: Alcohol, Ethyl (B): <10 mg/dL (ref <10)

## 2023-08-07 LAB — TSH: TSH: 0.807 u[IU]/mL (ref 0.350–4.500)

## 2023-08-07 LAB — T4, FREE: Free T4: 0.99 ng/dL (ref 0.61–1.12)

## 2023-08-07 LAB — ACETAMINOPHEN LEVEL: Acetaminophen (Tylenol), Serum: <10 ug/mL — ABNORMAL LOW (ref 10–30)

## 2023-08-07 LAB — MAGNESIUM: Magnesium: 1.9 mg/dL (ref 1.7–2.4)

## 2023-08-07 MED ORDER — IOHEXOL 300 MG/ML  SOLN
100.0000 mL | Freq: Once | INTRAMUSCULAR | Status: AC | PRN
  Administered 2023-08-07: 100 mL via INTRAVENOUS

## 2023-08-07 MED ORDER — LACTATED RINGERS IV BOLUS
1000.0000 mL | Freq: Once | INTRAVENOUS | Status: AC
  Administered 2023-08-07: 1000 mL via INTRAVENOUS

## 2023-08-07 MED ORDER — LORAZEPAM 2 MG/ML IJ SOLN
1.0000 mg | Freq: Once | INTRAMUSCULAR | Status: AC
  Administered 2023-08-07: 1 mg via INTRAVENOUS
  Filled 2023-08-07: qty 1

## 2023-08-07 MED ORDER — LORAZEPAM 1 MG PO TABS: 1.0000 mg | ORAL_TABLET | Freq: Three times a day (TID) | ORAL | 0 refills | Status: AC | PRN

## 2023-08-07 NOTE — ED Provider Notes (Signed)
  Physical Exam  BP 138/77   Pulse 68   Temp (!) 97.5 F (36.4 C)   Resp (!) 22   Wt 78.9 kg   SpO2 97%   BMI 28.96 kg/m   Physical Exam  Procedures  Procedures  ED Course / MDM   Clinical Course as of 08/07/23 1612  Tue Aug 07, 2023  1502 Assumed care from Dr. Karene Fry.  73 year old female with hx of severe anxiety who scented with a panic attack and diarrhea. Has had diarrhea for several months straight. Is on trazodone and sertraline and prn atarax. Is having passive si but no plan. Very anxious and hyperactive. Having abdominal pain with ttp. Family was able to contract for safety. Shouldn't need ivc. Felt diarrhea could be 2/2 anxiety.  [RP]  1539 CT scan without acute findings.  Did have incidental findings of gallstones, or she kidney, and hiatal hernia. [RP]  1611 Patient reassessed.  Unable to give a stool sample.  Informed of the incidental findings on her CT scan.  She tells me that she has had increase stress from her son moving home and the fact that he is not easy to get along with.  No active SI.  Says that she does not feel that she would make an attempt on her life if she were to go home.  Will have her follow-up with the behavioral health urgent care.  She states that the Ativan helped her symptoms significantly.  Will give her a short course but did explain to her that she should not be on this for a long period of time and that this is to treat panic attacks.  Return precautions discussed prior to discharge. [RP]    Clinical Course User Index [RP] Rondel Baton, MD   Medical Decision Making Amount and/or Complexity of Data Reviewed Labs: ordered. Radiology: ordered.  Risk Prescription drug management.     Rondel Baton, MD 08/07/23 [phone]

## 2023-08-07 NOTE — ED Provider Notes (Signed)
 [place] [facility]ERGENCY DEPARTMENT AT [facility] Provider Note   CSN: [id number] Arrival date & time: 08/07/23  1110     History  Chief Complaint  Patient presents with   Panic Attack    Paula Poole is a 73 y.o. female.  HPI   73 year old female with medical history significant for depression, anxiety, HLD, anemia, arthritis, GERD presenting to the emergency department with a chief complaint of anxiety and diarrhea.  The patient has had diarrhea for the last 2 to 3 weeks.  She denies any blood in her stool.  She denies any vomiting.  She endorses generalized mild abdominal discomfort.  She states that she has been feeling extremely anxious and has been having persistent "panic attacks" throughout the day.  Symptoms last all day however indicating more anxiety symptoms that are uncontrolled.  She endorses thoughts of self-harm but denies any plan to harm herself.  She denies any HI or AVH.  She has been compliant with her home medications to include trazodone 50 mg at bedtime, sertraline 100 mg daily, hydroxyzine 25 mg as needed once a day.  No previous antibiotic use.  No blood in her diarrhea.  She is tolerating oral intake.  Family bedside was able to contract for her safety in the home.  No genitourinary symptoms. She denies any plan of SI but states "sometimes I wish I wasn't here anymore. I wouldn't overdose on pills, I dont like swallowing pills."  Home Medications Prior to Admission medications   Medication Sig Start Date End Date Taking? Authorizing Provider  Cholecalciferol (VITAMIN D3) 25 MCG (1000 UT) CAPS Take by mouth daily.    [provider]  ELDERBERRY PO Take by mouth.    [provider]  gemfibrozil (LOPID) 600 MG tablet Take 600 mg by mouth at bedtime. 10/29/17   [provider]  hydrOXYzine (VISTARIL) 25 MG capsule Take 50 mg by mouth at bedtime. 09/16/16   [provider]  Magnesium 500 MG CAPS Take by mouth daily.     [provider]  meloxicam (MOBIC) 15 MG tablet Take 15 mg by mouth daily.    [provider]  naproxen sodium (ALEVE) 220 MG tablet Take 220 mg by mouth 2 (two) times daily as needed (for pain.).    [provider]  POTASSIUM PO Take 650 mg by mouth daily.    [provider]  traZODone (DESYREL) 50 MG tablet Take 50 mg by mouth at bedtime.  09/11/16   [provider]  vitamin B-12 (CYANOCOBALAMIN) 100 MCG tablet Take 100 mcg by mouth daily.    [provider]      Allergies    Nickel, Blackberry flavoring agent (non-screening), and Latex    Review of Systems   Review of Systems  All other systems reviewed and are negative.   Physical Exam Updated Vital Signs BP 138/77   Pulse 68   Temp (!) 97.5 F (36.4 C)   Resp (!) 22   Wt 78.9 kg   SpO2 97%   BMI 28.96 kg/m  Physical Exam Vitals and nursing note reviewed.  Constitutional:      General: She is not in acute distress.    Appearance: She is well-developed.  HENT:     Head: Normocephalic and atraumatic.  Eyes:     Conjunctiva/sclera: Conjunctivae normal.  Cardiovascular:     Rate and Rhythm: Normal rate and regular rhythm.     Heart sounds: No murmur heard. Pulmonary:  Effort: Pulmonary effort is normal. No respiratory distress.     Breath sounds: Normal breath sounds.  Abdominal:     Palpations: Abdomen is soft.     Tenderness: There is abdominal tenderness. There is no guarding.  Musculoskeletal:        General: No swelling.     Cervical back: Neck supple.  Skin:    General: Skin is warm and dry.     Capillary Refill: Capillary refill takes less than 2 seconds.  Neurological:     General: No focal deficit present.     Mental Status: She is alert. Mental status is at baseline.     Cranial Nerves: No cranial nerve deficit.     Sensory: No sensory deficit.     Motor: No weakness.     Comments: No clonus  Psychiatric:        Mood and Affect: Mood is  anxious.        Speech: Speech normal.        Behavior: Behavior is agitated and hyperactive. Behavior is cooperative.        Thought Content: Thought content is not paranoid. Thought content includes suicidal ideation. Thought content does not include homicidal ideation. Thought content does not include suicidal plan.     ED Results / Procedures / Treatments   Labs (all labs ordered are listed, but only abnormal results are displayed) Labs Reviewed  CBC WITH DIFFERENTIAL/PLATELET - Abnormal; Notable for the following components:      Result Value   WBC 11.6 (*)    Hemoglobin 15.5 (*)    Platelets 144 (*)    Neutro Abs 9.5 (*)    All other components within normal limits  COMPREHENSIVE METABOLIC PANEL WITH GFR - Abnormal; Notable for the following components:   ALT 47 (*)    All other components within normal limits  ACETAMINOPHEN LEVEL - Abnormal; Notable for the following components:   Acetaminophen (Tylenol), Serum <10 (*)    All other components within normal limits  SALICYLATE LEVEL - Abnormal; Notable for the following components:   Salicylate Lvl <7.0 (*)    All other components within normal limits  GASTROINTESTINAL PANEL BY PCR, STOOL (REPLACES STOOL CULTURE)  C DIFFICILE QUICK SCREEN W PCR REFLEX    MAGNESIUM  TSH  ETHANOL  RAPID URINE DRUG SCREEN, HOSP PERFORMED  URINALYSIS, ROUTINE W REFLEX MICROSCOPIC  T4, FREE    EKG EKG Interpretation Date/Time:  Tuesday August 07 2023 11:30:48 EDT Ventricular Rate:  69 PR Interval:  130 QRS Duration:  82 QT Interval:  396 QTC Calculation: 425 R Axis:   58  Text Interpretation: Sinus rhythm Low voltage, precordial leads No significant change since last tracing Confirmed by Ernie Avena ([id number]) on 08/07/2023 12:40:49 PM  Radiology No results found.  Procedures Procedures    Medications Ordered in ED Medications  LORazepam (ATIVAN) injection 1 mg (1 mg Intravenous Given 08/07/23 1312)  lactated ringers bolus 1,000 mL  (0 mLs Intravenous Stopped 08/07/23 1503)  iohexol (OMNIPAQUE) 300 MG/ML solution 100 mL (100 mLs Intravenous Contrast Given 08/07/23 1414)    ED Course/ Medical Decision Making/ A&P Clinical Course as of 08/07/23 1522  Tue Aug 07, 2023  1502 Hx of severe anxiety. Has had diarrhea for several months straight. Is on trazodone and sertraline and prn atarax. Is having si. Very anxious and hyperactive. Having abdominal pain with ttp. When medically cleared will need tts consult. No specific plan. Family was able to contract for safety.  Shouldn't need ivc. Felt diarrhea could be 2/2 anxiety.  [RP]    Clinical Course User Index [RP] Rondel Baton, MD                                 Medical Decision Making Amount and/or Complexity of Data Reviewed Labs: ordered. Radiology: ordered.  Risk Prescription drug management.    72 year old female with medical history significant for depression, anxiety, HLD, anemia, arthritis, GERD presenting to the emergency department with a chief complaint of anxiety and diarrhea.  The patient has had diarrhea for the last 2 to 3 weeks.  She denies any blood in her stool.  She denies any vomiting.  She endorses generalized mild abdominal discomfort.  She states that she has been feeling extremely anxious and has been having persistent "panic attacks" throughout the day.  Symptoms last all day however indicating more anxiety symptoms that are uncontrolled.  She endorses thoughts of self-harm but denies any plan to harm herself.  She denies any HI or AVH.  She has been compliant with her home medications to include trazodone 50 mg at bedtime, sertraline 100 mg daily, hydroxyzine 25 mg as needed once a day.  No previous antibiotic use.  No blood in her diarrhea.  She is tolerating oral intake.  Family bedside was able to contract for her safety in the home.  No genitourinary symptoms. She denies any plan of SI but states "sometimes I wish I wasn't here anymore. I wouldn't  overdose on pills, I dont like swallowing pills."  On arrival, the patient was afebrile, temperature 97.5, not tachycardic or tachypneic, saturating 93 to 97% on room air, initially hypertensive BP 161/84, improved to 138/77 with IV Ativan.  Patient presenting with suicidal thoughts, no clear plan, no HI or AVH.  Endorsing severe anxiety and symptoms of depression over the past month with associated diarrheal illness.  Considered infectious etiology, considered symptoms of anxiety, considered diverticulitis, less likely bowel obstruction or other acute intra-abdominal abnormality.  Patient with mild generalized tenderness on exam.  Patient presenting voluntarily, do not see indication for IVC at this time.  IV access was obtained and the patient was administered 1 mg IV Ativan, 1 L LR bolus.  Laboratory evaluation revealed CBC with a nonspecific leukocytosis, evidence of hemoconcentration with a hemoglobin of 15.5, CMP generally unremarkable, magnesium normal, TSH normal, UDS negative urinalysis unremarkable, Tylenol, salicylate level and ethanol level normal.  CT of the abdomen pelvis and chest x-ray was obtained, results were pending radiology reads at time of signout.  Plan at time of signout to follow-up results of reads, ultimate disposition pending results of reassessment.  Patient could benefit from TTS consultation versus close outpatient follow-up at the [facility] health urgent care with psychiatry depending on ultimate reassessment.  Signout given to Dr. Jarold Motto at 1500.   Final Clinical Impression(s) / ED Diagnoses Final diagnoses:  Anxiety  Depression, unspecified depression type  Diarrhea, unspecified type  Verbalizes suicidal thoughts    Rx / DC Orders ED Discharge Orders     None         Ernie Avena, MD 08/07/23 1523

## 2023-08-07 NOTE — Discharge Instructions (Signed)
 You were seen for your anxiety and diarrhea in the emergency department.   At home, please stay well-hydrated.  Take the Ativan for any panic attacks you have.  Please note that this is not a good medication for longstanding anxiety.    Check your MyChart online for the results of any tests that had not resulted by the time you left the emergency department.   Follow-up with your primary doctor in 2-3 days regarding your visit.  Go to behavioral health urgent care for additional resources on your anxiety.  Return immediately to the emergency department if you experience any of the following: Severe abdominal pain, worsening suicidal thoughts, or any other concerning symptoms.    Thank you for visiting our Emergency Department. It was a pleasure taking care of you today.

## 2023-08-07 NOTE — ED Notes (Signed)
 Urine in lab

## 2023-08-07 NOTE — ED Triage Notes (Signed)
 Pt c/o "panic attack". Endorses diarrhea, and anxiety that has increased over last 2-3 weeks. Reports son hs recently moved in with her.

## 2023-08-20 DIAGNOSIS — F3341 Major depressive disorder, recurrent, in partial remission: Secondary | ICD-10-CM | POA: Diagnosis not present

## 2023-08-23 DIAGNOSIS — F3341 Major depressive disorder, recurrent, in partial remission: Secondary | ICD-10-CM | POA: Diagnosis not present

## 2023-09-07 DIAGNOSIS — F3341 Major depressive disorder, recurrent, in partial remission: Secondary | ICD-10-CM | POA: Diagnosis not present

## 2023-09-12 DIAGNOSIS — F3341 Major depressive disorder, recurrent, in partial remission: Secondary | ICD-10-CM | POA: Diagnosis not present

## 2023-09-14 DIAGNOSIS — F3341 Major depressive disorder, recurrent, in partial remission: Secondary | ICD-10-CM | POA: Diagnosis not present

## 2023-09-19 DIAGNOSIS — F3341 Major depressive disorder, recurrent, in partial remission: Secondary | ICD-10-CM | POA: Diagnosis not present

## 2023-09-21 DIAGNOSIS — F3341 Major depressive disorder, recurrent, in partial remission: Secondary | ICD-10-CM | POA: Diagnosis not present

## 2023-09-28 DIAGNOSIS — F3341 Major depressive disorder, recurrent, in partial remission: Secondary | ICD-10-CM | POA: Diagnosis not present

## 2023-10-02 DIAGNOSIS — M1712 Unilateral primary osteoarthritis, left knee: Secondary | ICD-10-CM | POA: Diagnosis not present

## 2023-10-02 DIAGNOSIS — M170 Bilateral primary osteoarthritis of knee: Secondary | ICD-10-CM | POA: Diagnosis not present

## 2023-10-02 DIAGNOSIS — M1711 Unilateral primary osteoarthritis, right knee: Secondary | ICD-10-CM | POA: Diagnosis not present

## 2023-10-05 DIAGNOSIS — F3341 Major depressive disorder, recurrent, in partial remission: Secondary | ICD-10-CM | POA: Diagnosis not present

## 2023-10-10 DIAGNOSIS — F3341 Major depressive disorder, recurrent, in partial remission: Secondary | ICD-10-CM | POA: Diagnosis not present

## 2023-10-12 DIAGNOSIS — F3341 Major depressive disorder, recurrent, in partial remission: Secondary | ICD-10-CM | POA: Diagnosis not present

## 2023-10-17 DIAGNOSIS — F3341 Major depressive disorder, recurrent, in partial remission: Secondary | ICD-10-CM | POA: Diagnosis not present

## 2023-10-19 DIAGNOSIS — F3341 Major depressive disorder, recurrent, in partial remission: Secondary | ICD-10-CM | POA: Diagnosis not present

## 2023-10-26 DIAGNOSIS — F3341 Major depressive disorder, recurrent, in partial remission: Secondary | ICD-10-CM | POA: Diagnosis not present

## 2023-10-29 DIAGNOSIS — K219 Gastro-esophageal reflux disease without esophagitis: Secondary | ICD-10-CM | POA: Diagnosis not present

## 2023-10-29 DIAGNOSIS — F332 Major depressive disorder, recurrent severe without psychotic features: Secondary | ICD-10-CM | POA: Diagnosis not present

## 2023-10-29 DIAGNOSIS — R197 Diarrhea, unspecified: Secondary | ICD-10-CM | POA: Diagnosis not present

## 2023-10-29 DIAGNOSIS — E785 Hyperlipidemia, unspecified: Secondary | ICD-10-CM | POA: Diagnosis not present

## 2023-10-29 DIAGNOSIS — F419 Anxiety disorder, unspecified: Secondary | ICD-10-CM | POA: Diagnosis not present

## 2023-11-02 DIAGNOSIS — F3341 Major depressive disorder, recurrent, in partial remission: Secondary | ICD-10-CM | POA: Diagnosis not present

## 2023-11-16 DIAGNOSIS — F3341 Major depressive disorder, recurrent, in partial remission: Secondary | ICD-10-CM | POA: Diagnosis not present

## 2023-11-23 DIAGNOSIS — H18451 Nodular corneal degeneration, right eye: Secondary | ICD-10-CM | POA: Diagnosis not present

## 2023-11-27 ENCOUNTER — Other Ambulatory Visit (HOSPITAL_COMMUNITY): Payer: Self-pay | Admitting: Orthopedic Surgery

## 2023-11-27 DIAGNOSIS — M25562 Pain in left knee: Secondary | ICD-10-CM | POA: Diagnosis not present

## 2023-12-05 DIAGNOSIS — F3341 Major depressive disorder, recurrent, in partial remission: Secondary | ICD-10-CM | POA: Diagnosis not present

## 2023-12-06 ENCOUNTER — Encounter (HOSPITAL_COMMUNITY)
Admission: RE | Admit: 2023-12-06 | Discharge: 2023-12-06 | Disposition: A | Discharge status: Home / Self Care | Source: Ambulatory Visit | Attending: Orthopedic Surgery | Admitting: Orthopedic Surgery

## 2023-12-06 ENCOUNTER — Ambulatory Visit (HOSPITAL_COMMUNITY)
Admission: RE | Admit: 2023-12-06 | Discharge: 2023-12-06 | Disposition: A | Discharge status: Home / Self Care | Source: Ambulatory Visit | Attending: Orthopedic Surgery | Admitting: Orthopedic Surgery

## 2023-12-06 ENCOUNTER — Encounter (HOSPITAL_COMMUNITY): Payer: Self-pay

## 2023-12-06 DIAGNOSIS — M25562 Pain in left knee: Secondary | ICD-10-CM | POA: Diagnosis not present

## 2023-12-06 DIAGNOSIS — Z96653 Presence of artificial knee joint, bilateral: Secondary | ICD-10-CM | POA: Diagnosis not present

## 2023-12-06 MED ORDER — TECHNETIUM TC 99M MEDRONATE IV KIT
20.3000 | PACK | Freq: Once | INTRAVENOUS | Status: AC
  Administered 2023-12-06: 20.3 via INTRAVENOUS

## 2023-12-07 DIAGNOSIS — F3341 Major depressive disorder, recurrent, in partial remission: Secondary | ICD-10-CM | POA: Diagnosis not present

## 2023-12-11 DIAGNOSIS — D649 Anemia, unspecified: Secondary | ICD-10-CM | POA: Diagnosis not present

## 2023-12-11 DIAGNOSIS — Z1212 Encounter for screening for malignant neoplasm of rectum: Secondary | ICD-10-CM | POA: Diagnosis not present

## 2023-12-11 DIAGNOSIS — I2584 Coronary atherosclerosis due to calcified coronary lesion: Secondary | ICD-10-CM | POA: Diagnosis not present

## 2023-12-11 DIAGNOSIS — E785 Hyperlipidemia, unspecified: Secondary | ICD-10-CM | POA: Diagnosis not present

## 2023-12-11 DIAGNOSIS — K219 Gastro-esophageal reflux disease without esophagitis: Secondary | ICD-10-CM | POA: Diagnosis not present

## 2023-12-11 DIAGNOSIS — R7301 Impaired fasting glucose: Secondary | ICD-10-CM | POA: Diagnosis not present

## 2023-12-13 DIAGNOSIS — M25562 Pain in left knee: Secondary | ICD-10-CM | POA: Diagnosis not present

## 2023-12-17 DIAGNOSIS — K8020 Calculus of gallbladder without cholecystitis without obstruction: Secondary | ICD-10-CM | POA: Diagnosis not present

## 2023-12-17 DIAGNOSIS — I2584 Coronary atherosclerosis due to calcified coronary lesion: Secondary | ICD-10-CM | POA: Diagnosis not present

## 2023-12-17 DIAGNOSIS — Z800 Family history of malignant neoplasm of digestive organs: Secondary | ICD-10-CM | POA: Diagnosis not present

## 2023-12-17 DIAGNOSIS — R82998 Other abnormal findings in urine: Secondary | ICD-10-CM | POA: Diagnosis not present

## 2023-12-17 DIAGNOSIS — E669 Obesity, unspecified: Secondary | ICD-10-CM | POA: Diagnosis not present

## 2023-12-17 DIAGNOSIS — K760 Fatty (change of) liver, not elsewhere classified: Secondary | ICD-10-CM | POA: Diagnosis not present

## 2023-12-17 DIAGNOSIS — Z0000 Encounter for general adult medical examination without abnormal findings: Secondary | ICD-10-CM | POA: Diagnosis not present

## 2023-12-17 DIAGNOSIS — F330 Major depressive disorder, recurrent, mild: Secondary | ICD-10-CM | POA: Diagnosis not present

## 2023-12-17 DIAGNOSIS — E785 Hyperlipidemia, unspecified: Secondary | ICD-10-CM | POA: Diagnosis not present

## 2023-12-17 DIAGNOSIS — D696 Thrombocytopenia, unspecified: Secondary | ICD-10-CM | POA: Diagnosis not present

## 2023-12-17 DIAGNOSIS — I700 Atherosclerosis of aorta: Secondary | ICD-10-CM | POA: Diagnosis not present

## 2023-12-17 DIAGNOSIS — K219 Gastro-esophageal reflux disease without esophagitis: Secondary | ICD-10-CM | POA: Diagnosis not present

## 2023-12-17 DIAGNOSIS — N3281 Overactive bladder: Secondary | ICD-10-CM | POA: Diagnosis not present

## 2024-01-04 DIAGNOSIS — F3341 Major depressive disorder, recurrent, in partial remission: Secondary | ICD-10-CM | POA: Diagnosis not present

## 2024-01-11 DIAGNOSIS — F3341 Major depressive disorder, recurrent, in partial remission: Secondary | ICD-10-CM | POA: Diagnosis not present

## 2024-01-17 DIAGNOSIS — Z1339 Encounter for screening examination for other mental health and behavioral disorders: Secondary | ICD-10-CM | POA: Diagnosis not present

## 2024-01-17 DIAGNOSIS — Z1331 Encounter for screening for depression: Secondary | ICD-10-CM | POA: Diagnosis not present

## 2024-01-23 ENCOUNTER — Other Ambulatory Visit: Payer: Self-pay | Admitting: Endocrinology

## 2024-01-23 DIAGNOSIS — Z1231 Encounter for screening mammogram for malignant neoplasm of breast: Secondary | ICD-10-CM

## 2024-01-25 DIAGNOSIS — F3341 Major depressive disorder, recurrent, in partial remission: Secondary | ICD-10-CM | POA: Diagnosis not present

## 2024-02-01 DIAGNOSIS — F3341 Major depressive disorder, recurrent, in partial remission: Secondary | ICD-10-CM | POA: Diagnosis not present

## 2024-02-08 DIAGNOSIS — F3341 Major depressive disorder, recurrent, in partial remission: Secondary | ICD-10-CM | POA: Diagnosis not present

## 2024-02-15 DIAGNOSIS — F3341 Major depressive disorder, recurrent, in partial remission: Secondary | ICD-10-CM | POA: Diagnosis not present

## 2024-02-29 DIAGNOSIS — F3341 Major depressive disorder, recurrent, in partial remission: Secondary | ICD-10-CM | POA: Diagnosis not present

## 2024-03-07 DIAGNOSIS — F3341 Major depressive disorder, recurrent, in partial remission: Secondary | ICD-10-CM | POA: Diagnosis not present

## 2024-03-10 ENCOUNTER — Ambulatory Visit
Admission: RE | Admit: 2024-03-10 | Discharge: 2024-03-10 | Disposition: A | Source: Ambulatory Visit | Attending: Endocrinology | Admitting: Endocrinology

## 2024-03-10 DIAGNOSIS — Z1231 Encounter for screening mammogram for malignant neoplasm of breast: Secondary | ICD-10-CM | POA: Diagnosis not present

## 2024-03-12 DIAGNOSIS — F3341 Major depressive disorder, recurrent, in partial remission: Secondary | ICD-10-CM | POA: Diagnosis not present

## 2024-03-14 DIAGNOSIS — F3341 Major depressive disorder, recurrent, in partial remission: Secondary | ICD-10-CM | POA: Diagnosis not present

## 2024-03-21 DIAGNOSIS — F3341 Major depressive disorder, recurrent, in partial remission: Secondary | ICD-10-CM | POA: Diagnosis not present

## 2024-03-28 DIAGNOSIS — F3341 Major depressive disorder, recurrent, in partial remission: Secondary | ICD-10-CM | POA: Diagnosis not present

## 2024-04-11 DIAGNOSIS — F3341 Major depressive disorder, recurrent, in partial remission: Secondary | ICD-10-CM | POA: Diagnosis not present
